# Patient Record
Sex: Female | Born: 1970 | Race: White | Hispanic: No | Marital: Married | Smoking: Never smoker
Health system: Southern US, Community
[De-identification: ages and names within clinical notes are randomized; demographics above are authoritative.]

## PROBLEM LIST (undated history)

## (undated) DIAGNOSIS — N979 Female infertility, unspecified: Secondary | ICD-10-CM

## (undated) DIAGNOSIS — E041 Nontoxic single thyroid nodule: Secondary | ICD-10-CM

## (undated) DIAGNOSIS — C801 Malignant (primary) neoplasm, unspecified: Secondary | ICD-10-CM

## (undated) DIAGNOSIS — G43909 Migraine, unspecified, not intractable, without status migrainosus: Secondary | ICD-10-CM

## (undated) HISTORY — DX: Female infertility, unspecified: N97.9

## (undated) HISTORY — DX: Malignant (primary) neoplasm, unspecified: C80.1

## (undated) HISTORY — DX: Migraine, unspecified, not intractable, without status migrainosus: G43.909

## (undated) HISTORY — DX: Nontoxic single thyroid nodule: E04.1

## (undated) HISTORY — PX: MASTECTOMY: SHX3

---

## 1999-10-20 ENCOUNTER — Encounter: Admission: RE | Admit: 1999-10-20 | Discharge: 1999-10-20 | Payer: Self-pay | Admitting: Gynecology

## 1999-10-20 ENCOUNTER — Encounter: Payer: Self-pay | Admitting: Gynecology

## 2000-07-31 ENCOUNTER — Other Ambulatory Visit: Admission: RE | Admit: 2000-07-31 | Discharge: 2000-07-31 | Payer: Self-pay | Admitting: Obstetrics and Gynecology

## 2000-11-13 ENCOUNTER — Encounter: Payer: Self-pay | Admitting: Obstetrics and Gynecology

## 2000-11-13 ENCOUNTER — Observation Stay (HOSPITAL_COMMUNITY): Admission: AD | Admit: 2000-11-13 | Discharge: 2000-11-13 | Payer: Self-pay | Admitting: Obstetrics and Gynecology

## 2001-02-11 ENCOUNTER — Inpatient Hospital Stay (HOSPITAL_COMMUNITY): Admission: AD | Admit: 2001-02-11 | Discharge: 2001-02-14 | Payer: Self-pay | Admitting: Obstetrics and Gynecology

## 2001-11-20 ENCOUNTER — Other Ambulatory Visit: Admission: RE | Admit: 2001-11-20 | Discharge: 2001-11-20 | Payer: Self-pay | Admitting: Gynecology

## 2003-01-04 ENCOUNTER — Inpatient Hospital Stay (HOSPITAL_COMMUNITY): Admission: AD | Admit: 2003-01-04 | Discharge: 2003-01-06 | Payer: Self-pay | Admitting: Obstetrics and Gynecology

## 2003-02-09 ENCOUNTER — Other Ambulatory Visit: Admission: RE | Admit: 2003-02-09 | Discharge: 2003-02-09 | Payer: Self-pay | Admitting: Obstetrics and Gynecology

## 2003-02-27 ENCOUNTER — Ambulatory Visit (HOSPITAL_BASED_OUTPATIENT_CLINIC_OR_DEPARTMENT_OTHER): Admission: RE | Admit: 2003-02-27 | Discharge: 2003-02-27 | Payer: Self-pay | Admitting: Otolaryngology

## 2003-11-03 ENCOUNTER — Other Ambulatory Visit: Admission: RE | Admit: 2003-11-03 | Discharge: 2003-11-03 | Payer: Self-pay | Admitting: Gynecology

## 2003-12-12 HISTORY — PX: BREAST LUMPECTOMY: SHX2

## 2004-10-10 ENCOUNTER — Encounter (INDEPENDENT_AMBULATORY_CARE_PROVIDER_SITE_OTHER): Payer: Self-pay | Admitting: *Deleted

## 2004-10-10 ENCOUNTER — Encounter: Admission: RE | Admit: 2004-10-10 | Discharge: 2004-10-10 | Payer: Self-pay | Admitting: Obstetrics and Gynecology

## 2004-10-11 ENCOUNTER — Encounter: Admission: RE | Admit: 2004-10-11 | Discharge: 2004-10-11 | Payer: Self-pay | Admitting: Obstetrics and Gynecology

## 2004-10-12 ENCOUNTER — Ambulatory Visit: Payer: Self-pay | Admitting: Oncology

## 2004-10-17 ENCOUNTER — Ambulatory Visit (HOSPITAL_BASED_OUTPATIENT_CLINIC_OR_DEPARTMENT_OTHER): Admission: RE | Admit: 2004-10-17 | Discharge: 2004-10-17 | Payer: Self-pay | Admitting: General Surgery

## 2004-10-17 ENCOUNTER — Encounter (HOSPITAL_BASED_OUTPATIENT_CLINIC_OR_DEPARTMENT_OTHER): Payer: Self-pay | Admitting: General Surgery

## 2004-10-17 ENCOUNTER — Encounter (INDEPENDENT_AMBULATORY_CARE_PROVIDER_SITE_OTHER): Payer: Self-pay | Admitting: *Deleted

## 2004-10-17 ENCOUNTER — Ambulatory Visit (HOSPITAL_COMMUNITY): Admission: RE | Admit: 2004-10-17 | Discharge: 2004-10-17 | Payer: Self-pay | Admitting: General Surgery

## 2004-11-02 ENCOUNTER — Ambulatory Visit (HOSPITAL_COMMUNITY): Admission: RE | Admit: 2004-11-02 | Discharge: 2004-11-02 | Payer: Self-pay | Admitting: Obstetrics and Gynecology

## 2004-12-09 ENCOUNTER — Ambulatory Visit: Payer: Self-pay | Admitting: Oncology

## 2004-12-11 HISTORY — PX: TRAM: SHX5363

## 2004-12-11 HISTORY — PX: ABDOMINAL HYSTERECTOMY: SHX81

## 2004-12-28 ENCOUNTER — Ambulatory Visit (HOSPITAL_COMMUNITY): Admission: RE | Admit: 2004-12-28 | Discharge: 2004-12-28 | Payer: Self-pay | Admitting: Obstetrics and Gynecology

## 2005-01-18 ENCOUNTER — Ambulatory Visit (HOSPITAL_COMMUNITY): Admission: RE | Admit: 2005-01-18 | Discharge: 2005-01-18 | Payer: Self-pay | Admitting: Obstetrics and Gynecology

## 2005-01-18 ENCOUNTER — Other Ambulatory Visit: Admission: RE | Admit: 2005-01-18 | Discharge: 2005-01-18 | Payer: Self-pay | Admitting: Obstetrics and Gynecology

## 2005-02-08 ENCOUNTER — Ambulatory Visit: Payer: Self-pay | Admitting: Oncology

## 2005-02-08 ENCOUNTER — Ambulatory Visit (HOSPITAL_COMMUNITY): Admission: RE | Admit: 2005-02-08 | Discharge: 2005-02-08 | Payer: Self-pay | Admitting: Obstetrics and Gynecology

## 2005-03-01 ENCOUNTER — Ambulatory Visit (HOSPITAL_COMMUNITY): Admission: RE | Admit: 2005-03-01 | Discharge: 2005-03-01 | Payer: Self-pay | Admitting: Obstetrics and Gynecology

## 2005-03-17 ENCOUNTER — Inpatient Hospital Stay (HOSPITAL_COMMUNITY): Admission: AD | Admit: 2005-03-17 | Discharge: 2005-03-19 | Payer: Self-pay | Admitting: Obstetrics and Gynecology

## 2005-03-17 ENCOUNTER — Encounter (INDEPENDENT_AMBULATORY_CARE_PROVIDER_SITE_OTHER): Payer: Self-pay | Admitting: *Deleted

## 2005-03-28 ENCOUNTER — Ambulatory Visit: Payer: Self-pay | Admitting: Oncology

## 2005-05-01 ENCOUNTER — Observation Stay (HOSPITAL_COMMUNITY): Admission: RE | Admit: 2005-05-01 | Discharge: 2005-05-02 | Payer: Self-pay | Admitting: Gynecology

## 2005-05-01 ENCOUNTER — Encounter (INDEPENDENT_AMBULATORY_CARE_PROVIDER_SITE_OTHER): Payer: Self-pay | Admitting: *Deleted

## 2005-05-18 ENCOUNTER — Ambulatory Visit: Payer: Self-pay | Admitting: Oncology

## 2005-07-18 ENCOUNTER — Ambulatory Visit (HOSPITAL_COMMUNITY): Admission: RE | Admit: 2005-07-18 | Discharge: 2005-07-18 | Payer: Self-pay | Admitting: Oncology

## 2005-07-21 ENCOUNTER — Ambulatory Visit: Payer: Self-pay | Admitting: Oncology

## 2005-07-28 ENCOUNTER — Inpatient Hospital Stay (HOSPITAL_COMMUNITY): Admission: RE | Admit: 2005-07-28 | Discharge: 2005-07-31 | Payer: Self-pay | Admitting: General Surgery

## 2005-07-28 ENCOUNTER — Encounter (INDEPENDENT_AMBULATORY_CARE_PROVIDER_SITE_OTHER): Payer: Self-pay | Admitting: Specialist

## 2005-07-31 ENCOUNTER — Ambulatory Visit: Payer: Self-pay | Admitting: Oncology

## 2005-09-22 ENCOUNTER — Ambulatory Visit: Payer: Self-pay | Admitting: Oncology

## 2005-10-19 ENCOUNTER — Ambulatory Visit (HOSPITAL_COMMUNITY): Admission: RE | Admit: 2005-10-19 | Discharge: 2005-10-19 | Payer: Self-pay | Admitting: Oncology

## 2005-10-25 ENCOUNTER — Ambulatory Visit (HOSPITAL_COMMUNITY): Admission: RE | Admit: 2005-10-25 | Discharge: 2005-10-25 | Payer: Self-pay | Admitting: Oncology

## 2005-11-06 ENCOUNTER — Other Ambulatory Visit: Admission: RE | Admit: 2005-11-06 | Discharge: 2005-11-06 | Payer: Self-pay | Admitting: Interventional Radiology

## 2005-11-06 ENCOUNTER — Encounter (INDEPENDENT_AMBULATORY_CARE_PROVIDER_SITE_OTHER): Payer: Self-pay | Admitting: *Deleted

## 2005-11-06 ENCOUNTER — Encounter: Admission: RE | Admit: 2005-11-06 | Discharge: 2005-11-06 | Payer: Self-pay | Admitting: Oncology

## 2005-12-28 ENCOUNTER — Ambulatory Visit: Payer: Self-pay | Admitting: Oncology

## 2006-02-19 ENCOUNTER — Ambulatory Visit: Payer: Self-pay | Admitting: Oncology

## 2006-08-09 ENCOUNTER — Ambulatory Visit: Payer: Self-pay | Admitting: Oncology

## 2006-08-14 LAB — COMPREHENSIVE METABOLIC PANEL
ALT: 18 U/L (ref 0–40)
AST: 18 U/L (ref 0–37)
Albumin: 4.7 g/dL (ref 3.5–5.2)
Alkaline Phosphatase: 95 U/L (ref 39–117)
BUN: 16 mg/dL (ref 6–23)
CO2: 30 mEq/L (ref 19–32)
Calcium: 9.8 mg/dL (ref 8.4–10.5)
Chloride: 102 mEq/L (ref 96–112)
Creatinine, Ser: 0.78 mg/dL (ref 0.40–1.20)
Glucose, Bld: 63 mg/dL — ABNORMAL LOW (ref 70–99)
Potassium: 4.4 mEq/L (ref 3.5–5.3)
Sodium: 140 mEq/L (ref 135–145)
Total Bilirubin: 0.6 mg/dL (ref 0.3–1.2)
Total Protein: 7.4 g/dL (ref 6.0–8.3)

## 2006-08-14 LAB — CBC WITH DIFFERENTIAL/PLATELET
BASO%: 0.5 % (ref 0.0–2.0)
Basophils Absolute: 0 10*3/uL (ref 0.0–0.1)
EOS%: 2.9 % (ref 0.0–7.0)
Eosinophils Absolute: 0.1 10*3/uL (ref 0.0–0.5)
HCT: 41.2 % (ref 34.8–46.6)
HGB: 14.5 g/dL (ref 11.6–15.9)
LYMPH%: 48.8 % — ABNORMAL HIGH (ref 14.0–48.0)
MCH: 30.8 pg (ref 26.0–34.0)
MCHC: 35.2 g/dL (ref 32.0–36.0)
MCV: 87.4 fL (ref 81.0–101.0)
MONO#: 0.3 10*3/uL (ref 0.1–0.9)
MONO%: 6.9 % (ref 0.0–13.0)
NEUT#: 1.9 10*3/uL (ref 1.5–6.5)
NEUT%: 40.9 % (ref 39.6–76.8)
Platelets: 189 10*3/uL (ref 145–400)
RBC: 4.72 10*6/uL (ref 3.70–5.32)
RDW: 12.8 % (ref 11.3–14.5)
WBC: 4.7 10*3/uL (ref 3.9–10.0)
lymph#: 2.3 10*3/uL (ref 0.9–3.3)

## 2006-08-14 LAB — TSH: TSH: 1.738 u[IU]/mL (ref 0.350–5.500)

## 2006-08-14 LAB — CANCER ANTIGEN 27.29: CA 27.29: 20 U/mL (ref 0–39)

## 2006-08-17 ENCOUNTER — Ambulatory Visit (HOSPITAL_COMMUNITY): Admission: RE | Admit: 2006-08-17 | Discharge: 2006-08-17 | Payer: Self-pay | Admitting: Oncology

## 2006-10-03 ENCOUNTER — Other Ambulatory Visit: Admission: RE | Admit: 2006-10-03 | Discharge: 2006-10-03 | Payer: Self-pay | Admitting: Gynecology

## 2006-12-06 ENCOUNTER — Ambulatory Visit: Payer: Self-pay | Admitting: Oncology

## 2006-12-14 LAB — COMPREHENSIVE METABOLIC PANEL
ALT: 21 U/L (ref 0–35)
AST: 15 U/L (ref 0–37)
Albumin: 5.1 g/dL (ref 3.5–5.2)
Alkaline Phosphatase: 127 U/L — ABNORMAL HIGH (ref 39–117)
BUN: 16 mg/dL (ref 6–23)
CO2: 30 mEq/L (ref 19–32)
Calcium: 10.1 mg/dL (ref 8.4–10.5)
Chloride: 102 mEq/L (ref 96–112)
Creatinine, Ser: 0.66 mg/dL (ref 0.40–1.20)
Glucose, Bld: 92 mg/dL (ref 70–99)
Potassium: 4.2 mEq/L (ref 3.5–5.3)
Sodium: 141 mEq/L (ref 135–145)
Total Bilirubin: 0.3 mg/dL (ref 0.3–1.2)
Total Protein: 7.8 g/dL (ref 6.0–8.3)

## 2006-12-14 LAB — CBC WITH DIFFERENTIAL/PLATELET
BASO%: 0.4 % (ref 0.0–2.0)
Basophils Absolute: 0 10*3/uL (ref 0.0–0.1)
EOS%: 3 % (ref 0.0–7.0)
Eosinophils Absolute: 0.1 10*3/uL (ref 0.0–0.5)
HCT: 42.4 % (ref 34.8–46.6)
HGB: 14.6 g/dL (ref 11.6–15.9)
LYMPH%: 49 % — ABNORMAL HIGH (ref 14.0–48.0)
MCH: 29.5 pg (ref 26.0–34.0)
MCHC: 34.3 g/dL (ref 32.0–36.0)
MCV: 85.8 fL (ref 81.0–101.0)
MONO#: 0.4 10*3/uL (ref 0.1–0.9)
MONO%: 8.5 % (ref 0.0–13.0)
NEUT#: 1.8 10*3/uL (ref 1.5–6.5)
NEUT%: 39.1 % — ABNORMAL LOW (ref 39.6–76.8)
Platelets: 214 10*3/uL (ref 145–400)
RBC: 4.94 10*6/uL (ref 3.70–5.32)
RDW: 13.2 % (ref 11.3–14.5)
WBC: 4.7 10*3/uL (ref 3.9–10.0)
lymph#: 2.3 10*3/uL (ref 0.9–3.3)

## 2006-12-14 LAB — CANCER ANTIGEN 27.29: CA 27.29: 17 U/mL (ref 0–39)

## 2006-12-14 LAB — FOLLICLE STIMULATING HORMONE: FSH: 122.4 m[IU]/mL

## 2006-12-17 ENCOUNTER — Ambulatory Visit (HOSPITAL_COMMUNITY): Admission: RE | Admit: 2006-12-17 | Discharge: 2006-12-17 | Payer: Self-pay | Admitting: Oncology

## 2006-12-24 LAB — ESTRADIOL, ULTRA SENS: Estradiol, Ultra Sensitive: 125 pg/mL

## 2007-03-13 ENCOUNTER — Ambulatory Visit: Payer: Self-pay | Admitting: Oncology

## 2007-03-17 IMAGING — CT NM PET TUM IMG SKULL BASE T - THIGH
1 series · 2 of 25 positions shown · IV contrast ([ID])
Comparison: Prior PET CT of 07/18/05.

CLINICAL DATA: Breast cancer with prior lumpectomy.  Hysterectomy this year.  Chemotherapy finished in June 2005.
FDG PET-CT TUMOR IMAGING (SKULL BASE TO THIGHS) ? 10/19/05: 
Fasting Blood Glucose:  84.
TECHNIQUE: 17.1 mCi F18-FDG were administered via the right antecubital vein.  Full ring PET imaging was performed from the skull base through the mid-thighs 43 minutes after injection.  CT data was obtained and used for attenuation correction and anatomic localization only.  (This was not acquired as a diagnostic CT examination.)

[Series 2: ct images · axial · 3.8mm · 0.98mm/px · z∈[-111,-75]mm · 2 of 267 slices shown]
[im 233/267  brain]
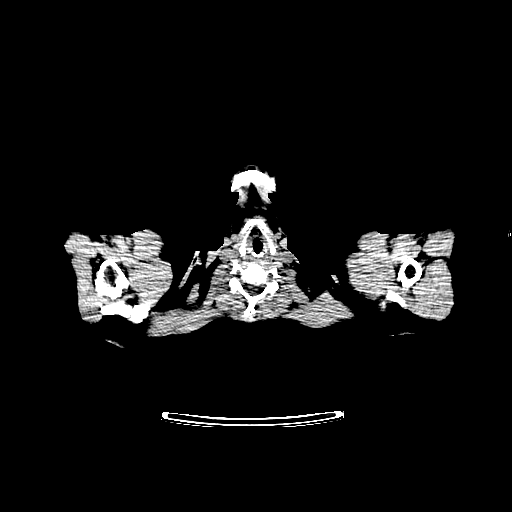
[im 244/267  brain]
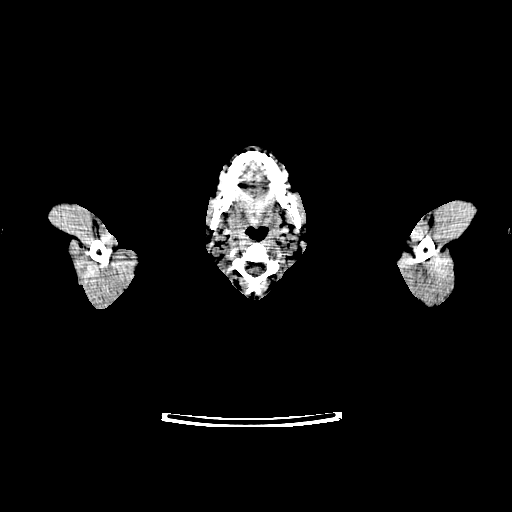

[2 of 25 positions shown; findings below may reference images not displayed]

FINDINGS: Review of the CT data confirms findings on recent diagnostic CT scan.  The patient has had interval TRAM flap breast reconstruction.  I checked hepatic cysts are noted and appear stable and benign.  
There is some mildly increased activity at the lower end of the TRAM flap wound, which is believe to be postoperative in nature.  Previously, there was noted to be some adnexal activity for which follow-up was recommended.  Currently we do not demonstrate significant abnormal adnexal activity separate from the blood pool of the iliac vessels.  No adenopathy is identified nor is there significant ovarian remnant noted in the patient who is status post hysterectomy and oophorectomy.  There is no significant abnormal metabolic activity to suggest residual or recurrent breast malignancy.  The patient does have a known small right thyroid nodule.  This has borderline increased activity inferiorly, and if not previously performed then thyroid ultrasound would be suggested for further workup.  Specifically, although thyroid adenomas can be increased on PET evaluation, the possibility of thyroid malignancy cannot be completely discounted and further workup is recommended.
IMPRESSION: 1.  No evidence of residual or recurrent breast cancer.
2.  Faint activity in a right thyroid nodule indicates need for further workup.  Initially I would recommend thyroid ultrasound.

## 2007-03-18 LAB — CBC WITH DIFFERENTIAL/PLATELET
BASO%: 0.5 % (ref 0.0–2.0)
Basophils Absolute: 0 10*3/uL (ref 0.0–0.1)
EOS%: 1.2 % (ref 0.0–7.0)
Eosinophils Absolute: 0 10*3/uL (ref 0.0–0.5)
HCT: 38.9 % (ref 34.8–46.6)
HGB: 14 g/dL (ref 11.6–15.9)
LYMPH%: 43.7 % (ref 14.0–48.0)
MCH: 30.4 pg (ref 26.0–34.0)
MCHC: 36 g/dL (ref 32.0–36.0)
MCV: 84.4 fL (ref 81.0–101.0)
MONO#: 0.3 10*3/uL (ref 0.1–0.9)
MONO%: 7.8 % (ref 0.0–13.0)
NEUT#: 2 10*3/uL (ref 1.5–6.5)
NEUT%: 46.8 % (ref 39.6–76.8)
Platelets: 156 10*3/uL (ref 145–400)
RBC: 4.61 10*6/uL (ref 3.70–5.32)
RDW: 12.9 % (ref 11.3–14.5)
WBC: 4.3 10*3/uL (ref 3.9–10.0)
lymph#: 1.9 10*3/uL (ref 0.9–3.3)

## 2007-03-18 LAB — CANCER ANTIGEN 27.29: CA 27.29: 23 U/mL (ref 0–39)

## 2007-03-18 LAB — COMPREHENSIVE METABOLIC PANEL
ALT: 14 U/L (ref 0–35)
AST: 15 U/L (ref 0–37)
Albumin: 4.7 g/dL (ref 3.5–5.2)
Alkaline Phosphatase: 116 U/L (ref 39–117)
BUN: 14 mg/dL (ref 6–23)
CO2: 26 mEq/L (ref 19–32)
Calcium: 9.4 mg/dL (ref 8.4–10.5)
Chloride: 104 mEq/L (ref 96–112)
Creatinine, Ser: 0.68 mg/dL (ref 0.40–1.20)
Glucose, Bld: 86 mg/dL (ref 70–99)
Potassium: 3.8 mEq/L (ref 3.5–5.3)
Sodium: 140 mEq/L (ref 135–145)
Total Bilirubin: 0.5 mg/dL (ref 0.3–1.2)
Total Protein: 7.4 g/dL (ref 6.0–8.3)

## 2007-09-10 ENCOUNTER — Ambulatory Visit: Payer: Self-pay | Admitting: Oncology

## 2007-09-13 LAB — CBC WITH DIFFERENTIAL/PLATELET
BASO%: 0.5 % (ref 0.0–2.0)
Basophils Absolute: 0 10*3/uL (ref 0.0–0.1)
EOS%: 2.7 % (ref 0.0–7.0)
Eosinophils Absolute: 0.1 10*3/uL (ref 0.0–0.5)
HCT: 40.4 % (ref 34.8–46.6)
HGB: 14.5 g/dL (ref 11.6–15.9)
LYMPH%: 40.8 % (ref 14.0–48.0)
MCH: 30.1 pg (ref 26.0–34.0)
MCHC: 35.8 g/dL (ref 32.0–36.0)
MCV: 84.1 fL (ref 81.0–101.0)
MONO#: 0.3 10*3/uL (ref 0.1–0.9)
MONO%: 5.7 % (ref 0.0–13.0)
NEUT#: 2.3 10*3/uL (ref 1.5–6.5)
NEUT%: 50.3 % (ref 39.6–76.8)
Platelets: 180 10*3/uL (ref 145–400)
RBC: 4.81 10*6/uL (ref 3.70–5.32)
RDW: 13.2 % (ref 11.3–14.5)
WBC: 4.5 10*3/uL (ref 3.9–10.0)
lymph#: 1.8 10*3/uL (ref 0.9–3.3)

## 2007-09-13 LAB — COMPREHENSIVE METABOLIC PANEL
ALT: 21 U/L (ref 0–35)
AST: 18 U/L (ref 0–37)
Albumin: 4.6 g/dL (ref 3.5–5.2)
Alkaline Phosphatase: 111 U/L (ref 39–117)
BUN: 19 mg/dL (ref 6–23)
CO2: 26 mEq/L (ref 19–32)
Calcium: 9.2 mg/dL (ref 8.4–10.5)
Chloride: 103 mEq/L (ref 96–112)
Creatinine, Ser: 0.78 mg/dL (ref 0.40–1.20)
Glucose, Bld: 79 mg/dL (ref 70–99)
Potassium: 4 mEq/L (ref 3.5–5.3)
Sodium: 139 mEq/L (ref 135–145)
Total Bilirubin: 0.7 mg/dL (ref 0.3–1.2)
Total Protein: 7.2 g/dL (ref 6.0–8.3)

## 2007-09-13 LAB — CANCER ANTIGEN 27.29: CA 27.29: 19 U/mL (ref 0–39)

## 2007-09-13 LAB — FOLLICLE STIMULATING HORMONE: FSH: 126.9 m[IU]/mL

## 2007-09-17 ENCOUNTER — Ambulatory Visit (HOSPITAL_COMMUNITY): Admission: RE | Admit: 2007-09-17 | Discharge: 2007-09-17 | Payer: Self-pay | Admitting: Oncology

## 2007-09-23 LAB — ESTRADIOL, ULTRA SENS: Estradiol, Ultra Sensitive: 11 pg/mL

## 2007-09-30 ENCOUNTER — Ambulatory Visit (HOSPITAL_COMMUNITY): Admission: RE | Admit: 2007-09-30 | Discharge: 2007-09-30 | Payer: Self-pay | Admitting: Oncology

## 2008-03-18 ENCOUNTER — Ambulatory Visit: Payer: Self-pay | Admitting: Oncology

## 2008-03-23 LAB — CBC WITH DIFFERENTIAL/PLATELET
BASO%: 0.7 % (ref 0.0–2.0)
Basophils Absolute: 0 10*3/uL (ref 0.0–0.1)
EOS%: 2.6 % (ref 0.0–7.0)
Eosinophils Absolute: 0.1 10*3/uL (ref 0.0–0.5)
HCT: 37.6 % (ref 34.8–46.6)
HGB: 13 g/dL (ref 11.6–15.9)
LYMPH%: 48.6 % — ABNORMAL HIGH (ref 14.0–48.0)
MCH: 29.2 pg (ref 26.0–34.0)
MCHC: 34.5 g/dL (ref 32.0–36.0)
MCV: 84.7 fL (ref 81.0–101.0)
MONO#: 0.2 10*3/uL (ref 0.1–0.9)
MONO%: 7.9 % (ref 0.0–13.0)
NEUT#: 1.2 10*3/uL — ABNORMAL LOW (ref 1.5–6.5)
NEUT%: 40.2 % (ref 39.6–76.8)
Platelets: 169 10*3/uL (ref 145–400)
RBC: 4.44 10*6/uL (ref 3.70–5.32)
RDW: 13.8 % (ref 11.3–14.5)
WBC: 3.1 10*3/uL — ABNORMAL LOW (ref 3.9–10.0)
lymph#: 1.5 10*3/uL (ref 0.9–3.3)

## 2008-03-23 LAB — COMPREHENSIVE METABOLIC PANEL
ALT: 15 U/L (ref 0–35)
AST: 14 U/L (ref 0–37)
Albumin: 4.4 g/dL (ref 3.5–5.2)
Alkaline Phosphatase: 88 U/L (ref 39–117)
BUN: 14 mg/dL (ref 6–23)
CO2: 26 mEq/L (ref 19–32)
Calcium: 9.6 mg/dL (ref 8.4–10.5)
Chloride: 107 mEq/L (ref 96–112)
Creatinine, Ser: 0.79 mg/dL (ref 0.40–1.20)
Glucose, Bld: 80 mg/dL (ref 70–99)
Potassium: 3.9 mEq/L (ref 3.5–5.3)
Sodium: 144 mEq/L (ref 135–145)
Total Bilirubin: 0.4 mg/dL (ref 0.3–1.2)
Total Protein: 6.8 g/dL (ref 6.0–8.3)

## 2008-03-23 LAB — CANCER ANTIGEN 27.29: CA 27.29: 19 U/mL (ref 0–39)

## 2008-03-23 LAB — FOLLICLE STIMULATING HORMONE: FSH: 111.8 m[IU]/mL

## 2008-03-24 ENCOUNTER — Ambulatory Visit (HOSPITAL_COMMUNITY): Admission: RE | Admit: 2008-03-24 | Discharge: 2008-03-24 | Payer: Self-pay | Admitting: Oncology

## 2008-04-01 LAB — ESTRADIOL, ULTRA SENS: Estradiol, Ultra Sensitive: 12 pg/mL

## 2008-04-06 ENCOUNTER — Other Ambulatory Visit: Admission: RE | Admit: 2008-04-06 | Discharge: 2008-04-06 | Payer: Self-pay | Admitting: Gynecology

## 2009-03-22 ENCOUNTER — Ambulatory Visit: Payer: Self-pay | Admitting: Oncology

## 2009-03-24 LAB — CBC WITH DIFFERENTIAL/PLATELET
BASO%: 0.5 % (ref 0.0–2.0)
Basophils Absolute: 0 10*3/uL (ref 0.0–0.1)
EOS%: 2.9 % (ref 0.0–7.0)
Eosinophils Absolute: 0.1 10*3/uL (ref 0.0–0.5)
HCT: 43.2 % (ref 34.8–46.6)
HGB: 14.8 g/dL (ref 11.6–15.9)
LYMPH%: 41.5 % (ref 14.0–49.7)
MCH: 29.6 pg (ref 25.1–34.0)
MCHC: 34.2 g/dL (ref 31.5–36.0)
MCV: 86.4 fL (ref 79.5–101.0)
MONO#: 0.3 10*3/uL (ref 0.1–0.9)
MONO%: 7.2 % (ref 0.0–14.0)
NEUT#: 2 10*3/uL (ref 1.5–6.5)
NEUT%: 47.9 % (ref 38.4–76.8)
Platelets: 170 10*3/uL (ref 145–400)
RBC: 5 10*6/uL (ref 3.70–5.45)
RDW: 13.2 % (ref 11.2–14.5)
WBC: 4.2 10*3/uL (ref 3.9–10.3)
lymph#: 1.7 10*3/uL (ref 0.9–3.3)

## 2009-03-24 LAB — TSH: TSH: 1.297 u[IU]/mL (ref 0.350–4.500)

## 2009-03-24 LAB — COMPREHENSIVE METABOLIC PANEL
ALT: 17 U/L (ref 0–35)
AST: 18 U/L (ref 0–37)
Albumin: 4.8 g/dL (ref 3.5–5.2)
Alkaline Phosphatase: 120 U/L — ABNORMAL HIGH (ref 39–117)
BUN: 22 mg/dL (ref 6–23)
CO2: 28 mEq/L (ref 19–32)
Calcium: 9.4 mg/dL (ref 8.4–10.5)
Chloride: 103 mEq/L (ref 96–112)
Creatinine, Ser: 0.76 mg/dL (ref 0.40–1.20)
Glucose, Bld: 61 mg/dL — ABNORMAL LOW (ref 70–99)
Potassium: 3.7 mEq/L (ref 3.5–5.3)
Sodium: 141 mEq/L (ref 135–145)
Total Bilirubin: 0.6 mg/dL (ref 0.3–1.2)
Total Protein: 7.5 g/dL (ref 6.0–8.3)

## 2009-03-24 LAB — CANCER ANTIGEN 27.29: CA 27.29: 24 U/mL (ref 0–39)

## 2009-04-06 LAB — HEPATIC FUNCTION PANEL
ALT: 17 U/L (ref 0–35)
AST: 16 U/L (ref 0–37)
Albumin: 4.8 g/dL (ref 3.5–5.2)
Alkaline Phosphatase: 111 U/L (ref 39–117)
Bilirubin, Direct: 0.1 mg/dL (ref 0.0–0.3)
Indirect Bilirubin: 0.4 mg/dL (ref 0.0–0.9)
Total Bilirubin: 0.5 mg/dL (ref 0.3–1.2)
Total Protein: 7.5 g/dL (ref 6.0–8.3)

## 2010-01-03 ENCOUNTER — Ambulatory Visit (HOSPITAL_COMMUNITY): Admission: RE | Admit: 2010-01-03 | Discharge: 2010-01-03 | Payer: Self-pay | Admitting: Oncology

## 2010-03-04 ENCOUNTER — Ambulatory Visit: Payer: Self-pay | Admitting: Oncology

## 2010-03-08 LAB — COMPREHENSIVE METABOLIC PANEL
ALT: 17 U/L (ref 0–35)
AST: 20 U/L (ref 0–37)
Albumin: 4.8 g/dL (ref 3.5–5.2)
Alkaline Phosphatase: 84 U/L (ref 39–117)
BUN: 12 mg/dL (ref 6–23)
CO2: 25 mEq/L (ref 19–32)
Calcium: 9.4 mg/dL (ref 8.4–10.5)
Chloride: 104 mEq/L (ref 96–112)
Creatinine, Ser: 0.7 mg/dL (ref 0.40–1.20)
Glucose, Bld: 87 mg/dL (ref 70–99)
Potassium: 3.8 mEq/L (ref 3.5–5.3)
Sodium: 141 mEq/L (ref 135–145)
Total Bilirubin: 0.6 mg/dL (ref 0.3–1.2)
Total Protein: 7.1 g/dL (ref 6.0–8.3)

## 2010-03-08 LAB — CANCER ANTIGEN 27.29: CA 27.29: 15 U/mL (ref 0–39)

## 2010-03-08 LAB — CBC WITH DIFFERENTIAL/PLATELET
BASO%: 0.6 % (ref 0.0–2.0)
Basophils Absolute: 0 10*3/uL (ref 0.0–0.1)
EOS%: 2.2 % (ref 0.0–7.0)
Eosinophils Absolute: 0.1 10*3/uL (ref 0.0–0.5)
HCT: 37.9 % (ref 34.8–46.6)
HGB: 13.1 g/dL (ref 11.6–15.9)
LYMPH%: 42.4 % (ref 14.0–49.7)
MCH: 30.7 pg (ref 25.1–34.0)
MCHC: 34.7 g/dL (ref 31.5–36.0)
MCV: 88.5 fL (ref 79.5–101.0)
MONO#: 0.2 10*3/uL (ref 0.1–0.9)
MONO%: 7.7 % (ref 0.0–14.0)
NEUT#: 1.5 10*3/uL (ref 1.5–6.5)
NEUT%: 47.1 % (ref 38.4–76.8)
Platelets: 161 10*3/uL (ref 145–400)
RBC: 4.28 10*6/uL (ref 3.70–5.45)
RDW: 14 % (ref 11.2–14.5)
WBC: 3.2 10*3/uL — ABNORMAL LOW (ref 3.9–10.3)
lymph#: 1.4 10*3/uL (ref 0.9–3.3)

## 2010-04-18 ENCOUNTER — Ambulatory Visit: Payer: Self-pay | Admitting: Oncology

## 2010-08-05 ENCOUNTER — Ambulatory Visit: Payer: Self-pay | Admitting: Oncology

## 2010-12-16 ENCOUNTER — Ambulatory Visit (HOSPITAL_COMMUNITY)
Admission: RE | Admit: 2010-12-16 | Discharge: 2010-12-16 | Payer: Self-pay | Source: Home / Self Care | Attending: Oncology | Admitting: Oncology

## 2011-01-01 ENCOUNTER — Encounter: Payer: Self-pay | Admitting: Oncology

## 2011-01-02 ENCOUNTER — Encounter: Payer: Self-pay | Admitting: Oncology

## 2011-01-10 ENCOUNTER — Other Ambulatory Visit (HOSPITAL_COMMUNITY)
Admission: RE | Admit: 2011-01-10 | Discharge: 2011-01-10 | Disposition: A | Discharge status: Home / Self Care | Payer: BC Managed Care – PPO | Source: Ambulatory Visit | Attending: Diagnostic Radiology | Admitting: Diagnostic Radiology

## 2011-01-10 ENCOUNTER — Other Ambulatory Visit (HOSPITAL_COMMUNITY): Admission: RE | Admit: 2011-01-10 | Payer: Self-pay | Source: Ambulatory Visit | Admitting: Diagnostic Radiology

## 2011-01-10 ENCOUNTER — Other Ambulatory Visit: Payer: Self-pay | Admitting: Diagnostic Radiology

## 2011-01-10 ENCOUNTER — Encounter
Admission: RE | Admit: 2011-01-10 | Discharge: 2011-01-10 | Payer: Self-pay | Source: Home / Self Care | Attending: Surgery | Admitting: Surgery

## 2011-01-10 DIAGNOSIS — E049 Nontoxic goiter, unspecified: Secondary | ICD-10-CM | POA: Insufficient documentation

## 2011-02-10 ENCOUNTER — Other Ambulatory Visit: Payer: Self-pay | Admitting: Oncology

## 2011-02-10 DIAGNOSIS — Z9889 Other specified postprocedural states: Secondary | ICD-10-CM

## 2011-02-13 ENCOUNTER — Other Ambulatory Visit: Payer: Self-pay | Admitting: Oncology

## 2011-02-13 DIAGNOSIS — C50919 Malignant neoplasm of unspecified site of unspecified female breast: Secondary | ICD-10-CM

## 2011-02-21 ENCOUNTER — Ambulatory Visit
Admission: RE | Admit: 2011-02-21 | Discharge: 2011-02-21 | Disposition: A | Discharge status: Home / Self Care | Payer: BC Managed Care – PPO | Source: Ambulatory Visit | Attending: Oncology | Admitting: Oncology

## 2011-02-21 ENCOUNTER — Other Ambulatory Visit: Payer: Self-pay | Admitting: Oncology

## 2011-02-21 DIAGNOSIS — Z9889 Other specified postprocedural states: Secondary | ICD-10-CM

## 2011-02-21 DIAGNOSIS — Z1239 Encounter for other screening for malignant neoplasm of breast: Secondary | ICD-10-CM

## 2011-02-23 ENCOUNTER — Inpatient Hospital Stay (HOSPITAL_COMMUNITY): Admission: RE | Admit: 2011-02-23 | Payer: BC Managed Care – PPO | Source: Ambulatory Visit

## 2011-02-24 ENCOUNTER — Ambulatory Visit (HOSPITAL_COMMUNITY)
Admission: RE | Admit: 2011-02-24 | Discharge: 2011-02-24 | Disposition: A | Discharge status: Home / Self Care | Payer: BC Managed Care – PPO | Source: Ambulatory Visit | Attending: Oncology | Admitting: Oncology

## 2011-02-24 DIAGNOSIS — K7689 Other specified diseases of liver: Secondary | ICD-10-CM | POA: Insufficient documentation

## 2011-02-24 DIAGNOSIS — C50919 Malignant neoplasm of unspecified site of unspecified female breast: Secondary | ICD-10-CM | POA: Insufficient documentation

## 2011-02-24 DIAGNOSIS — Z9010 Acquired absence of unspecified breast and nipple: Secondary | ICD-10-CM | POA: Insufficient documentation

## 2011-02-24 MED ORDER — GADOBENATE DIMEGLUMINE 529 MG/ML IV SOLN
15.0000 mL | Freq: Once | INTRAVENOUS | Status: AC | PRN
  Administered 2011-02-24: 15 mL via INTRAVENOUS

## 2011-04-28 NOTE — Op Note (Signed)
NAMEBRADI, ARBUTHNOT NO.:  [id number]   MEDICAL RECORD NO.:  [id number]          PATIENT TYPE:  INP   LOCATION:  2550                         FACILITY:  [facility]   PHYSICIAN:  Etter Sjogren, M.D.     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   PROCEDURE PERFORMED:  1.  Bilateral transverse rectus abdominis myocutaneous flap reconstruction.  2.  Placement of an On-Q pump.   SURGEON:  Etter Sjogren, M.D.   ASSISTANT:  Pleas Patricia, M.D.   ANESTHESIA:  General.   ESTIMATED BLOOD LOSS:  150 cc.   DRAINS:  One Harrison Mons to each chest and two for abdomen.   CLINICAL NOTE:  A 40 year old woman has left breast cancer.  Has the BRCA  gene and is scheduled for bilateral breast mastectomy.  She desires  reconstruction and prefers to use her own tissue.  This was discussed with  her in great detail, and she understood the risks plus implications.  An  extensive amount of time was spent in counseling on two separate occasions  and she wished to proceed.   PROCEDURE:  The patient was taken to the operating room.  The mastectomy has  been completed.  The incision was made around the umbilicus and then  generous fatty stalk left with the umbilicus.  The upper incision was made.  The dissection was carried out in a cephalad direction, minimizing and  undermining as much as possible due to her overall weight.  Subcutaneous  tunnels were made, connecting through to the mastectomy sites.  The lower  limb incision was then made after first checking to make sure that the  closure could be achieved.  The lower limb incision was made, and dissection  carried down to the fascia using  electrocautery.  Great care was taken to  avoid any damage to the underlying rectus muscles on these dissections.  The  flaps had been elevated up to the lateral row of perforators.  Flap was  divided in the midline and elevated over just to the left and the right of  the linea alba on each side.  The parallel incisions were then made in the  anterior fascia for the left and right rectus muscles, leaving a little over  2 cm wide strip on the anterior surface of the rectus muscles.  The rectus  muscle was then mobilized gently from the surrounding fascial attachments  using scalpel and bipolar and taking great care at the tendinous  inscriptions.  Having mobilized the flaps entirely, the deep inferior  epigastric vessels were identified bilaterally, and the rectus muscles  divided, leaving the deep, inferior epigastric vessels in place for now.   Attention was directed to the chest.  Thorough irrigation with saline.  Meticulous hemostasis was achieved with electrocautery.  Blake drains were  brought through the separate stab wounds inferolaterally and secured with a  3-0 Prolene suture.  The attention was then directed back to the abdomen,  where the deep inferior gastric vessels were divided bilaterally, using  triple ligations for the arteries and double ligation for the veins.  The  flaps were then gently passed through the subcutaneous tunnels onto the  right and left chest, respectively.  These were ipsilateral flaps.  Direct  care was taken to make sure that the muscle pedicles were not twisted or  torqued in any fashion.  The flaps had excellent color and bright red  bleeding around the periphery, consistent with viability.  The flaps had  excellent color and bright red bleeding around the periphery, consistent  with viability.  They were temporarily secured with skin staples covered  with a dry towel for warmth.  Attention was directed back to the abdomen.  The abdomen was irrigated  thoroughly with saline.  Hemostasis was achieved  using electrocautery.  The deep epigastric pedicles were checked, and  excellent hemostasis was noted.  The muscle pedicles for the flaps were also  checked.  There was no tension and no torquing of the muscle flaps.  In  closure, 0 Prolene interrupted figure-of-eight sutures, taking great care to  avoid damaging the intra-abdominal contents.  The fascia was closed  bilaterally.  The wound was irrigated with copious amounts of saline.  An  Onlay mesh was then placed, bringing the umbilicus up through the center of  the mesh and securing the mesh around the periphery using 2-0 Prolene simple  running suture.  Again, great care taken to avoid damage to the underlying  intra-abdominal contents.  Thorough irrigation again with saline.  Excellent  hemostasis was confirmed.  Blake drains were positioned, brought out through  separate stab wounds inferiorly, and secured with 3-0 Prolene sutures.  The  On-Q pump was then positioned in the abdomen.  Closure with 2-0 Vicryl  interrupted deep sutures for the deep Scarpa's fascial layer, 3-0 Monocryl  interrupted deep sutures, and 3-0 Monocryl running subcuticular sutures.  An  incision was then made at the midline just over the umbilicus, and the  umbilicus brought to this opening.  It was inspected and found to have  excellent color and was viable.  The umbilicus was in-set using 3-0 Vicryl  interrupted deep sutures.  Skin closure was very healthy and no evidence of  any skin compromise with the abdomen.   Attention was then directed back to the chest.  Thorough irrigation with  saline.  Meticulous hemostasis was achieved.  Excellent hemostasis having  been confirmed.  The inferior border of the flaps was in-set using 3-0  Monocryl interrupted deep sutures and 3-0 Monocryl running subcuticular  suture.  The flaps were then marked for the epithelization.  The tip of zone 2 was amputated bilaterally,  and it was bright red bleeding, consistent with  viability.  The flaps continued to have excellent color, and bright red  bleeding at the periphery, consistent with viability.  The deep  epithelization was performed, and the flaps were then suspended with 3-0  Vicryl sutures from the pectoralis fascia superiorly.  The remainder of the  flap in-setting with 3-0 Monocryl interrupted deep sutures, and 3-0 Monocryl  running subcuticular suture.  This procedure was six hours with two plastic  surgeons.  This was clearly greater than usual services.  This patient was  certainly heavy and with the bilateral  abdominal closure, this was a very  lengthy procedure.  The abdominal closure itself requiring two hours with  two surgeons working.  She was transferred to the recovery room in stable  condition, having tolerated the procedure well.      Etter Sjogren, M.D.  Electronically Signed     DB/MEDQ  D:  07/28/2005  T:  07/28/2005  Job:  [id number]

## 2011-04-28 NOTE — Discharge Summary (Signed)
NAMEMARONDA, CAISON NO.:  [id number]   MEDICAL RECORD NO.:  [id number]          PATIENT TYPE:  INP   LOCATION:  5732                         FACILITY:  [facility]   PHYSICIAN:  Etter Sjogren, M.D.     DATE OF BIRTH:  [birth year]   DATE OF ADMISSION:  07/28/2005  DATE OF DISCHARGE:  07/31/2005                                 DISCHARGE SUMMARY   FINAL DIAGNOSES:  1.  Breast cancer.  2.  Positive BRCA gene.   PROCEDURE PERFORMED:  1.  Bilateral mastectomy.  2.  Bilateral breast reconstruction with transverse rectus abdominis      myocutaneous flaps.   HISTORY OF PRESENT ILLNESS:  This 40 year old woman has breast cancer.  She  has a positive BRCA gene, and she had a hysterectomy 3 months ago and  presents for bilateral mastectomy due to the presence of that gene.  She  desired a breast reconstruction.  This was discussed with her extensively,  and she preferred to use her own tissue using the TRAM flaps.  The nature of  the procedure and its risks were discussed with her in detail and she wished  to proceed.  For further details of the History and Physical, please see the  chart.   COURSE IN HOSPITAL:  On admission, she was taken to surgery at which time a  bilateral mastectomy and bilateral reconstruction with transverse rectus  abdominis myocutaneous flaps were performed.  She tolerated this well.  The  flaps looked good initially, but in the recovery room the left flap did  become somewhat venous congested.  Medical leech therapy was performed.  This was continued into the evening that night.  There was excellent color  in the flap.  The flap converted to a pink color with 1 to 2 second  capillary refill.  No further leech therapy was needed.  She continued to do  well.  However, she was observed in the intensive care unit for 2 days to be  certain that further leech therapy would not be needed.  Urine output was  excellent.  Drains functioning.  No evidence of  any hematoma.  Postoperative  hemoglobins initially over 10, and then 2 days later 9.5 and stable.  By the  third day, she was tolerating a regular diet, was ambulating well, both  flaps had excellent color, capillary refill was 1 to 2 seconds, and the  abdominal skin flap also excellent color and no evidence of any vascular  compromise.  It was felt that she was ready to be discharge.  The On-Q pump  was removed this morning.   DISPOSITION:  1.  She is discharged on [facility], a total of 40, to be given 1 to 2      p.o. q.6h p.r.n. for pain, Keflex 500 mg p.o. q.i.d.  2.  Her activity restrictions will be no lifting or vigorous activities.  3.  Empty drains 3 times a day and record the amount.  4.  Follow up in the office in a week.     Etter Sjogren, M.D.  Electronically  Signed    DB/MEDQ  D:  07/31/2005  T:  07/31/2005  Job:  [id number]

## 2011-04-28 NOTE — H&P (Signed)
[name], Burton NO.:  [id number]   MEDICAL RECORD NO.:  [id number]          PATIENT TYPE:  AMB   LOCATION:  DAY                          FACILITY:  [facility]   PHYSICIAN:  Howard C. Mezer, M.D.  DATE OF BIRTH:  [birth year]   DATE OF ADMISSION:  DATE OF DISCHARGE:                                HISTORY & PHYSICAL   ADMITTING DIAGNOSIS:  Breast cancer and BRCA-1 positive.   HISTORY OF PRESENT ILLNESS:  The patient is a 40 year old white female,  approximately 6 weeks postpartum, status post lumpectomy and sentinel node  biopsy, admitted with breast cancer and BRCA-1 positive, for hysterectomy  and bilateral salpingo-oophorectomy.  The patient was diagnosed with  invasive ductal carcinoma at approximately [redacted] weeks gestation with her last  pregnancy, and has undergone a lumpectomy with sentinel node biopsy, and has  begun chemotherapy with Taxol.  At the recommendation of her oncologist, a  bilateral salpingo-oophorectomy will be performed.  After counseling by both  Dr. Dierdre Forth and myself, the patient has elected to proceed with  hysterectomy and bilateral salpingo-oophorectomy.  This procedure has been  discussed in detail with the patient and her husband, and potential  complications including, but not limited to, anesthesia, injury to the  bowel, bladder, ureters, possible fistula formation, possible blood loss  with transfusion and its sequelae, and possible infection in the wound or  the pelvis have been discussed in detail.  If the cervix is left, the  patient understands that there is a possible need to remove the cervix in  the future, and the possibility that she will have long-standing bleeding  from this cervical remnant.  The patient also understands that removing her  ovaries at this time does not guarantee that she will never have ovarian  cancer.  The patient understands that she may have significant menopausal  symptoms after the ovaries are  removed, and that it may be difficult to  treat these symptoms, as estrogen will be contraindicated.  Postoperative  expectations and restrictions have been reviewed in detail.  Pain control  has been reviewed.  The patient understands that after hysterectomy and  bilateral salpingo-oophorectomy there is no chance that she will be able to  become pregnant in the future.  All the patient's questions have been  answered, and she appears to have reasonable expectations regarding the  procedure and its alternatives.   PAST MEDICAL HISTORY:  1.  Surgical D&C.  2.  Diagnostic laparoscopy.  3.  Lumpectomy and sentinel node biopsy.   MEDICAL:  Breast cancer.   MEDICATIONS:  Chemotherapy with Taxol.  No herbs or supplements.   ALLERGIES:  None known.   SMOKES:  None.   ETOH:  None.   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   FAMILY HISTORY:  Negative for cancer.   PHYSICAL EXAMINATION:  HEENT:  Negative.  LUNGS:  Clear.  HEART:  Without murmurs.  BREASTS:  Without palpable new masses or discharge.  ABDOMEN:  Soft and nontender.  PELVIC:  EGBUS, vagina, and cervix normal.  The  uterus is anterior, normal  in size.  Adnexa are without palpable masses.  EXTREMITIES:  Negative.   IMPRESSION:  Breast cancer and BRCA-1 positive.   PLAN:  Hysterectomy with bilateral salpingo-oophorectomy.      HCM/MEDQ  D:  04/30/2005  T:  04/30/2005  Job:  [id number]   cc:   C. Duane Lope, M.D.  [address]  [place]  [place] [address]  Fax: [phone]   Valentino Hue. Magrinat, M.D.  [address] [facility]  [place]  [place] [address]  Fax: [phone]

## 2011-05-03 ENCOUNTER — Encounter (INDEPENDENT_AMBULATORY_CARE_PROVIDER_SITE_OTHER): Payer: Self-pay | Admitting: Surgery

## 2011-08-23 ENCOUNTER — Encounter (HOSPITAL_BASED_OUTPATIENT_CLINIC_OR_DEPARTMENT_OTHER): Payer: BC Managed Care – PPO | Admitting: Oncology

## 2011-08-23 ENCOUNTER — Other Ambulatory Visit: Payer: Self-pay | Admitting: Oncology

## 2011-08-23 DIAGNOSIS — C50919 Malignant neoplasm of unspecified site of unspecified female breast: Secondary | ICD-10-CM

## 2011-08-23 LAB — CBC WITH DIFFERENTIAL/PLATELET
BASO%: 0.6 % (ref 0.0–2.0)
Basophils Absolute: 0 10*3/uL (ref 0.0–0.1)
EOS%: 2.3 % (ref 0.0–7.0)
Eosinophils Absolute: 0.1 10*3/uL (ref 0.0–0.5)
HCT: 38.9 % (ref 34.8–46.6)
HGB: 13.6 g/dL (ref 11.6–15.9)
LYMPH%: 39.7 % (ref 14.0–49.7)
MCH: 30.7 pg (ref 25.1–34.0)
MCHC: 34.9 g/dL (ref 31.5–36.0)
MCV: 88.1 fL (ref 79.5–101.0)
MONO#: 0.3 10*3/uL (ref 0.1–0.9)
MONO%: 7.3 % (ref 0.0–14.0)
NEUT#: 2.1 10*3/uL (ref 1.5–6.5)
NEUT%: 50.1 % (ref 38.4–76.8)
Platelets: 163 10*3/uL (ref 145–400)
RBC: 4.42 10*6/uL (ref 3.70–5.45)
RDW: 12.9 % (ref 11.2–14.5)
WBC: 4.1 10*3/uL (ref 3.9–10.3)
lymph#: 1.6 10*3/uL (ref 0.9–3.3)

## 2011-08-24 LAB — CANCER ANTIGEN 27.29: CA 27.29: 20 U/mL (ref 0–39)

## 2011-08-24 LAB — COMPREHENSIVE METABOLIC PANEL
ALT: 13 U/L (ref 0–35)
AST: 17 U/L (ref 0–37)
Albumin: 4.8 g/dL (ref 3.5–5.2)
Alkaline Phosphatase: 106 U/L (ref 39–117)
BUN: 15 mg/dL (ref 6–23)
CO2: 28 mEq/L (ref 19–32)
Calcium: 9.4 mg/dL (ref 8.4–10.5)
Chloride: 103 mEq/L (ref 96–112)
Creatinine, Ser: 0.78 mg/dL (ref 0.50–1.10)
Glucose, Bld: 83 mg/dL (ref 70–99)
Potassium: 4.1 mEq/L (ref 3.5–5.3)
Sodium: 142 mEq/L (ref 135–145)
Total Bilirubin: 0.7 mg/dL (ref 0.3–1.2)
Total Protein: 7.2 g/dL (ref 6.0–8.3)

## 2011-08-24 LAB — VITAMIN D 25 HYDROXY (VIT D DEFICIENCY, FRACTURES): Vit D, 25-Hydroxy: 35 ng/mL (ref 30–89)

## 2011-08-30 ENCOUNTER — Encounter (HOSPITAL_BASED_OUTPATIENT_CLINIC_OR_DEPARTMENT_OTHER): Payer: BC Managed Care – PPO | Admitting: Oncology

## 2011-08-30 DIAGNOSIS — C50919 Malignant neoplasm of unspecified site of unspecified female breast: Secondary | ICD-10-CM

## 2011-11-23 ENCOUNTER — Encounter (INDEPENDENT_AMBULATORY_CARE_PROVIDER_SITE_OTHER): Payer: Self-pay | Admitting: Surgery

## 2012-01-11 ENCOUNTER — Ambulatory Visit (INDEPENDENT_AMBULATORY_CARE_PROVIDER_SITE_OTHER): Payer: BC Managed Care – PPO | Admitting: Surgery

## 2012-01-11 ENCOUNTER — Other Ambulatory Visit (INDEPENDENT_AMBULATORY_CARE_PROVIDER_SITE_OTHER): Payer: Self-pay | Admitting: Surgery

## 2012-01-11 DIAGNOSIS — E041 Nontoxic single thyroid nodule: Secondary | ICD-10-CM

## 2012-01-23 ENCOUNTER — Other Ambulatory Visit: Payer: Self-pay | Admitting: Oncology

## 2012-01-23 DIAGNOSIS — Z9013 Acquired absence of bilateral breasts and nipples: Secondary | ICD-10-CM

## 2012-01-23 DIAGNOSIS — Z1231 Encounter for screening mammogram for malignant neoplasm of breast: Secondary | ICD-10-CM

## 2012-02-09 ENCOUNTER — Other Ambulatory Visit: Payer: BC Managed Care – PPO

## 2012-02-15 ENCOUNTER — Encounter (INDEPENDENT_AMBULATORY_CARE_PROVIDER_SITE_OTHER): Payer: Self-pay | Admitting: Surgery

## 2012-02-15 ENCOUNTER — Ambulatory Visit
Admission: RE | Admit: 2012-02-15 | Discharge: 2012-02-15 | Disposition: A | Discharge status: Home / Self Care | Payer: PRIVATE HEALTH INSURANCE | Source: Ambulatory Visit | Attending: Surgery | Admitting: Surgery

## 2012-02-15 DIAGNOSIS — E041 Nontoxic single thyroid nodule: Secondary | ICD-10-CM

## 2012-02-19 ENCOUNTER — Encounter (INDEPENDENT_AMBULATORY_CARE_PROVIDER_SITE_OTHER): Payer: Self-pay | Admitting: Surgery

## 2012-02-19 ENCOUNTER — Ambulatory Visit (INDEPENDENT_AMBULATORY_CARE_PROVIDER_SITE_OTHER): Payer: PRIVATE HEALTH INSURANCE | Admitting: Surgery

## 2012-02-19 ENCOUNTER — Other Ambulatory Visit (INDEPENDENT_AMBULATORY_CARE_PROVIDER_SITE_OTHER): Payer: Self-pay | Admitting: Surgery

## 2012-02-19 VITALS — BP 116/74 | HR 66 | Temp 97.8°F | Resp 18 | Ht 60.0 in | Wt 139.6 lb

## 2012-02-19 DIAGNOSIS — E042 Nontoxic multinodular goiter: Secondary | ICD-10-CM

## 2012-02-19 DIAGNOSIS — E041 Nontoxic single thyroid nodule: Secondary | ICD-10-CM

## 2012-02-19 NOTE — Progress Notes (Signed)
Visit Diagnoses: 1. Multinodular goiter (nontoxic), dominant right nodule     HISTORY: Patient is a 41 year old white female with a history of thyroid nodule detected on PET scanning. She was last seen to 14 months ago. At that time she underwent ultrasound and ultrasound-guided fine needle aspiration biopsy. Cytopathology results were benign.  At my request she underwent a followup thyroid ultrasound on February 15, 2012. This shows an enlarged thyroid gland with bilateral small thyroid nodules. The dominant nodule was in the inferior right lobe and measures 2.6 x 1.0 x 1.3 cm. This is slightly decreased in size compared to one year ago.  PERTINENT REVIEW OF SYSTEMS: No new masses. No compressive symptoms. No palpitations. No tremors.  EXAM: HEENT: normocephalic; pupils equal and reactive; sclerae clear; dentition good; mucous membranes moist NECK:  Subtle right inferior thyroid nodule; symmetric on extension; no palpable anterior or posterior cervical lymphadenopathy; no supraclavicular masses; no tenderness CHEST: clear to auscultation bilaterally without rales, rhonchi, or wheezes CARDIAC: regular rate and rhythm without significant murmur; peripheral pulses are full EXT:  non-tender without edema; no deformity NEURO: no gross focal deficits; no sign of tremor   IMPRESSION: Right thyroid nodule, 2.6 cm, clinically stable, history of benign cytopathology  PLAN: The patient I reviewed the above information. I would like to get a TSH level checked today. We will perform a followup thyroid ultrasound in one year at [facility].  The patient and I reviewed all of the above information. At this point the nodule is clinically stable. There is no indication that this is malignant other than the finding on PET scan. I think it is safe to continue to monitor this nodule. At this point the patient does not wish surgical intervention.  Patient will return for followup in one year.  Velora Heckler, MD, FACS General & Endocrine Surgery [facility]

## 2012-02-20 LAB — TSH: TSH: 1.71 u[IU]/mL (ref 0.450–4.500)

## 2012-02-21 ENCOUNTER — Telehealth (INDEPENDENT_AMBULATORY_CARE_PROVIDER_SITE_OTHER): Payer: Self-pay

## 2012-02-21 NOTE — Progress Notes (Signed)
Patient aware of lab results.

## 2012-02-21 NOTE — Telephone Encounter (Signed)
error 

## 2012-02-22 ENCOUNTER — Ambulatory Visit
Admission: RE | Admit: 2012-02-22 | Discharge: 2012-02-22 | Disposition: A | Discharge status: Home / Self Care | Payer: PRIVATE HEALTH INSURANCE | Source: Ambulatory Visit | Attending: Oncology | Admitting: Oncology

## 2012-02-22 DIAGNOSIS — Z1231 Encounter for screening mammogram for malignant neoplasm of breast: Secondary | ICD-10-CM

## 2012-02-22 DIAGNOSIS — Z9013 Acquired absence of bilateral breasts and nipples: Secondary | ICD-10-CM

## 2012-06-05 ENCOUNTER — Other Ambulatory Visit: Payer: Self-pay | Admitting: *Deleted

## 2012-06-05 DIAGNOSIS — C50919 Malignant neoplasm of unspecified site of unspecified female breast: Secondary | ICD-10-CM

## 2012-06-05 DIAGNOSIS — C50912 Malignant neoplasm of unspecified site of left female breast: Secondary | ICD-10-CM | POA: Insufficient documentation

## 2012-06-05 DIAGNOSIS — E042 Nontoxic multinodular goiter: Secondary | ICD-10-CM

## 2012-06-05 NOTE — Progress Notes (Signed)
This RN spoke with pt per her call relating to notable hair loss. Concern is related more to known thyroid goiter " not cancer".  Per discussion Christie Burton has also started new diet with use of soy protein. Above discussed related to possible causes of hair loss.  Plan at present is to obtain labs to evaluate thyroid function.  If normal Christie Burton with change diet and monitor.  Appointment made for 6/27.

## 2012-06-06 ENCOUNTER — Ambulatory Visit: Payer: PRIVATE HEALTH INSURANCE | Admitting: Lab

## 2012-06-06 ENCOUNTER — Other Ambulatory Visit: Payer: Self-pay | Admitting: *Deleted

## 2012-06-06 DIAGNOSIS — L658 Other specified nonscarring hair loss: Secondary | ICD-10-CM | POA: Insufficient documentation

## 2012-06-06 DIAGNOSIS — E042 Nontoxic multinodular goiter: Secondary | ICD-10-CM

## 2012-06-06 DIAGNOSIS — C50919 Malignant neoplasm of unspecified site of unspecified female breast: Secondary | ICD-10-CM

## 2012-06-06 LAB — MAGNESIUM: Magnesium: 2.1 mg/dL (ref 1.5–2.5)

## 2012-06-06 LAB — CBC WITH DIFFERENTIAL/PLATELET
BASO%: 0.6 % (ref 0.0–2.0)
Basophils Absolute: 0 10*3/uL (ref 0.0–0.1)
EOS%: 1.7 % (ref 0.0–7.0)
Eosinophils Absolute: 0.1 10*3/uL (ref 0.0–0.5)
HCT: 41.4 % (ref 34.8–46.6)
HGB: 14.1 g/dL (ref 11.6–15.9)
LYMPH%: 40.4 % (ref 14.0–49.7)
MCH: 29.9 pg (ref 25.1–34.0)
MCHC: 34.1 g/dL (ref 31.5–36.0)
MCV: 87.6 fL (ref 79.5–101.0)
MONO#: 0.4 10*3/uL (ref 0.1–0.9)
MONO%: 8.1 % (ref 0.0–14.0)
NEUT#: 2.2 10*3/uL (ref 1.5–6.5)
NEUT%: 49.2 % (ref 38.4–76.8)
Platelets: 180 10*3/uL (ref 145–400)
RBC: 4.73 10*6/uL (ref 3.70–5.45)
RDW: 13.3 % (ref 11.2–14.5)
WBC: 4.5 10*3/uL (ref 3.9–10.3)
lymph#: 1.8 10*3/uL (ref 0.9–3.3)

## 2012-06-06 LAB — COMPREHENSIVE METABOLIC PANEL
ALT: 17 U/L (ref 0–35)
AST: 22 U/L (ref 0–37)
Albumin: 4.9 g/dL (ref 3.5–5.2)
Alkaline Phosphatase: 94 U/L (ref 39–117)
BUN: 15 mg/dL (ref 6–23)
CO2: 28 mEq/L (ref 19–32)
Calcium: 9.5 mg/dL (ref 8.4–10.5)
Chloride: 100 mEq/L (ref 96–112)
Creatinine, Ser: 0.76 mg/dL (ref 0.50–1.10)
Glucose, Bld: 83 mg/dL (ref 70–99)
Potassium: 4 mEq/L (ref 3.5–5.3)
Sodium: 137 mEq/L (ref 135–145)
Total Bilirubin: 0.8 mg/dL (ref 0.3–1.2)
Total Protein: 7.3 g/dL (ref 6.0–8.3)

## 2012-06-06 LAB — FERRITIN: Ferritin: 206 ng/mL (ref 10–291)

## 2012-06-06 LAB — TSH: TSH: 1.401 u[IU]/mL (ref 0.350–4.500)

## 2012-06-06 LAB — T4: T4, Total: 8.6 ug/dL (ref 5.0–12.5)

## 2012-06-06 LAB — VITAMIN B12: Vitamin B-12: 460 pg/mL (ref 211–911)

## 2012-06-06 LAB — PHOSPHORUS: Phosphorus: 3.4 mg/dL (ref 2.3–4.6)

## 2012-07-26 ENCOUNTER — Telehealth: Payer: Self-pay | Admitting: Oncology

## 2012-07-26 NOTE — Telephone Encounter (Signed)
lmonvm for pt re appts for 10/2 and 10/9 and mailed schedule

## 2012-09-11 ENCOUNTER — Other Ambulatory Visit: Payer: PRIVATE HEALTH INSURANCE | Admitting: Lab

## 2012-09-18 ENCOUNTER — Ambulatory Visit: Payer: PRIVATE HEALTH INSURANCE | Admitting: Oncology

## 2012-09-24 ENCOUNTER — Other Ambulatory Visit: Payer: Self-pay | Admitting: Oncology

## 2012-09-24 ENCOUNTER — Other Ambulatory Visit (HOSPITAL_BASED_OUTPATIENT_CLINIC_OR_DEPARTMENT_OTHER): Payer: PRIVATE HEALTH INSURANCE | Admitting: Lab

## 2012-09-24 DIAGNOSIS — C50919 Malignant neoplasm of unspecified site of unspecified female breast: Secondary | ICD-10-CM

## 2012-09-24 LAB — CBC WITH DIFFERENTIAL/PLATELET
BASO%: 0.7 % (ref 0.0–2.0)
Basophils Absolute: 0 10*3/uL (ref 0.0–0.1)
EOS%: 2.1 % (ref 0.0–7.0)
Eosinophils Absolute: 0.1 10*3/uL (ref 0.0–0.5)
HCT: 38.5 % (ref 34.8–46.6)
HGB: 13.5 g/dL (ref 11.6–15.9)
LYMPH%: 41.6 % (ref 14.0–49.7)
MCH: 30.3 pg (ref 25.1–34.0)
MCHC: 35 g/dL (ref 31.5–36.0)
MCV: 86.5 fL (ref 79.5–101.0)
MONO#: 0.4 10*3/uL (ref 0.1–0.9)
MONO%: 8.2 % (ref 0.0–14.0)
NEUT#: 2.3 10*3/uL (ref 1.5–6.5)
NEUT%: 47.4 % (ref 38.4–76.8)
Platelets: 164 10*3/uL (ref 145–400)
RBC: 4.45 10*6/uL (ref 3.70–5.45)
RDW: 12.8 % (ref 11.2–14.5)
WBC: 4.9 10*3/uL (ref 3.9–10.3)
lymph#: 2 10*3/uL (ref 0.9–3.3)

## 2012-09-24 LAB — COMPREHENSIVE METABOLIC PANEL (CC13)
ALT: 14 U/L (ref 0–55)
AST: 16 U/L (ref 5–34)
Albumin: 4.4 g/dL (ref 3.5–5.0)
Alkaline Phosphatase: 86 U/L (ref 40–150)
BUN: 15 mg/dL (ref 7.0–26.0)
CO2: 27 mEq/L (ref 22–29)
Calcium: 9.7 mg/dL (ref 8.4–10.4)
Chloride: 101 mEq/L (ref 98–107)
Creatinine: 0.8 mg/dL (ref 0.6–1.1)
Glucose: 80 mg/dl (ref 70–99)
Potassium: 4.1 mEq/L (ref 3.5–5.1)
Sodium: 139 mEq/L (ref 136–145)
Total Bilirubin: 0.7 mg/dL (ref 0.20–1.20)
Total Protein: 6.9 g/dL (ref 6.4–8.3)

## 2012-10-01 ENCOUNTER — Ambulatory Visit (HOSPITAL_BASED_OUTPATIENT_CLINIC_OR_DEPARTMENT_OTHER): Payer: PRIVATE HEALTH INSURANCE | Admitting: Oncology

## 2012-10-01 ENCOUNTER — Telehealth: Payer: Self-pay | Admitting: *Deleted

## 2012-10-01 VITALS — BP 106/73 | HR 86 | Temp 98.4°F | Resp 20 | Ht 60.0 in | Wt 141.6 lb

## 2012-10-01 DIAGNOSIS — Z1501 Genetic susceptibility to malignant neoplasm of breast: Secondary | ICD-10-CM

## 2012-10-01 DIAGNOSIS — Z9010 Acquired absence of unspecified breast and nipple: Secondary | ICD-10-CM

## 2012-10-01 DIAGNOSIS — C50919 Malignant neoplasm of unspecified site of unspecified female breast: Secondary | ICD-10-CM

## 2012-10-01 NOTE — Progress Notes (Signed)
ID: Christie Burton   DOB: 11-02-1971  MR#: [id number]  WUJ#:[id number]  PCP: Christie James MD GYN: Christie Medici MD SU: Christie Level, MD OTHER MD: Christie Burton   HISTORY OF PRESENT ILLNESS: Christie Burton was around 16 weeks of her 7th pregnancy when she noted a lump in her left breast. She brought this to the attention of Dr. Janelle Floor Burton's office.  [facility], where baseline mammogram was performed November 1st.  This showed extremely dense fibrous tissue bilaterally without dominant masses.    Ultrasound of the breast on the left showed the palpable mass at 3 o'clock, 1 cm from the nipple, to be hypoechoic, with lobulated borders.  The left axilla showed no enlarged adenopathy.  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  There was no evidence of lymphovascular invasion and no in situ component. By ultrasound, the mass measured approximately 2.1 cm.  The patient's subsequent history is as detailed below  INTERVAL HISTORY: Christie Burton returns today with her son Christie Burton for followup of her remote breast cancer. Interval history is unremarkable. She recently traveled to [place] with her father and sister. She is very busy home schooling their children so she is not exercising as much as she normally does.  REVIEW OF SYSTEMS: She has rare headaches which are not unusual a persistent or intense. She has developed a couple fatty tumors. Overall though a detailed review of systems today was entirely negative  PAST MEDICAL HISTORY: Past Medical History  Diagnosis Date  . Cancer     BREAST  . Thyroid nodule     PAST SURGICAL HISTORY: Past Surgical History  Procedure Date  . Tram 2006    WITH RECONSTRUCTION  . Breast lumpectomy 2005  . Abdominal hysterectomy 2006    FAMILY HISTORY No family history on file. The patient's father lives in [place].  The patient's mother lives in  [place]. Christie Burton has one brother and one sister. There is no history of breast or ovarian cancer in the immediate family.  GYNECOLOGIC HISTORY: The patient had pregnancies of which she was able to carry four to term.  She had three miscarriages.    SOCIAL HISTORY: (updated OCT 2013) The patient is a homemaker.  Her husband, Christie Burton, works as a Development worker, community.  He is originally from [place].  Christie Burton comes from a [employer] background and they attend the [employer].  Christie Burton has a Education administrator in school psychology.  Their children are two daughters aged 52 and 4 and two sons aged 63 and 13. They are being home-schooled ADVANCED DIRECTIVES:  HEALTH MAINTENANCE: History  Substance Use Topics  . Smoking status: Never Smoker   . Smokeless tobacco: Never Used  . Alcohol Use: Yes     2 per week     Colonoscopy:  PAP:  Bone density:  Lipid panel:  No Known Allergies  Current Outpatient Prescriptions  Medication Sig Dispense Refill  . clobetasol (TEMOVATE) 0.05 % external solution       . Fluocinolone Acetonide 0.01 % OIL       . SUMAtriptan (IMITREX) 50 MG tablet         OBJECTIVE: Middle-aged white woman who appears well Filed Vitals:   10/01/12 1440  BP: 106/73  Pulse: 86  Temp: 98.4 F (36.9 C)  Resp: 20     Body mass index is 27.65 kg/(m^2).  ECOG FS: 0  Sclerae unicteric Oropharynx clear No cervical or supraclavicular adenopathy Lungs no rales or rhonchi Heart regular rate and rhythm Abd benign MSK no focal spinal tenderness, no peripheral edema Neuro: nonfocal Breasts: Status post bilateral mastectomies with TRAM reconstruction. There is no evidence of local recurrence. Both axillae are benign.   LAB RESULTS: Lab Results  Component Value Date   WBC 4.9 09/24/2012   NEUTROABS 2.3 09/24/2012   HGB 13.5 09/24/2012   HCT 38.5 09/24/2012   MCV 86.5 09/24/2012   PLT 164 09/24/2012      Chemistry      Component Value Date/Time   NA 139  09/24/2012 1407   NA 137 06/06/2012 1255   K 4.1 09/24/2012 1407   K 4.0 06/06/2012 1255   CL 101 09/24/2012 1407   CL 100 06/06/2012 1255   CO2 27 09/24/2012 1407   CO2 28 06/06/2012 1255   BUN 15.0 09/24/2012 1407   BUN 15 06/06/2012 1255   CREATININE 0.8 09/24/2012 1407   CREATININE 0.76 06/06/2012 1255      Component Value Date/Time   CALCIUM 9.7 09/24/2012 1407   CALCIUM 9.5 06/06/2012 1255   ALKPHOS 86 09/24/2012 1407   ALKPHOS 94 06/06/2012 1255   AST 16 09/24/2012 1407   AST 22 06/06/2012 1255   ALT 14 09/24/2012 1407   ALT 17 06/06/2012 1255   BILITOT 0.70 09/24/2012 1407   BILITOT 0.8 06/06/2012 1255       Lab Results  Component Value Date   LABCA2 20 08/23/2011   LABCA2 20 08/23/2011    No components found with this basename: [id number]    No results found for this basename: INR:1;PROTIME:1 in the last 168 hours  Urinalysis No results found for this basename: colorurine, appearanceur, labspec, phurine, glucoseu, hgbur, bilirubinur, ketonesur, proteinur, urobilinogen, nitrite, leukocytesur    STUDIES: Mammography March 2013 was unremarkable  ASSESSMENT: 40 y.o. BRCA-1 positive [place] woman status post left lumpectomy and sentinel lymph node biopsy November 2005 for a T2 N0 grade 3 invasive ductal carcinoma treated adjuvant with Cytoxan, Adriamycin and 5-FU every 3 weeks during her pregnancy, then Taxol weekly x12 after delivery.  All chemotherapy completed August 2006 at which time she had bilateral mastectomies with TRAM reconstruction, showing no residual tumor.  She has also undergone bilateral salpingo-oophorectomy and hysterectomy as of May 2006.    PLAN: Christie Burton is doing terrific from a breast cancer point of view. We are going to see her 2 more times, and 2014 and 2015 and then likely we will discharge her from followup here. She knows to call for any problems that may develop before that visit.   Christie Burton C    10/01/2012

## 2012-10-01 NOTE — Telephone Encounter (Signed)
Gave patient appointment for 10-01-2013 starting at 2:45pm

## 2013-01-14 ENCOUNTER — Other Ambulatory Visit: Payer: PRIVATE HEALTH INSURANCE

## 2013-02-17 ENCOUNTER — Ambulatory Visit
Admission: RE | Admit: 2013-02-17 | Discharge: 2013-02-17 | Disposition: A | Discharge status: Home / Self Care | Payer: BC Managed Care – PPO | Source: Ambulatory Visit | Attending: Surgery | Admitting: Surgery

## 2013-02-17 DIAGNOSIS — E041 Nontoxic single thyroid nodule: Secondary | ICD-10-CM

## 2013-02-21 ENCOUNTER — Encounter (INDEPENDENT_AMBULATORY_CARE_PROVIDER_SITE_OTHER): Payer: Self-pay | Admitting: Surgery

## 2013-02-21 ENCOUNTER — Ambulatory Visit (INDEPENDENT_AMBULATORY_CARE_PROVIDER_SITE_OTHER): Payer: BC Managed Care – PPO | Admitting: Surgery

## 2013-02-21 VITALS — BP 100/62 | HR 78 | Resp 18 | Ht 60.0 in | Wt 145.0 lb

## 2013-02-21 DIAGNOSIS — E042 Nontoxic multinodular goiter: Secondary | ICD-10-CM

## 2013-02-21 NOTE — Progress Notes (Signed)
General Surgery [facility]  Visit Diagnoses: 1. Multinodular goiter (nontoxic), dominant right nodule     HISTORY: Patient is a 42 year old white female followed for bilateral thyroid nodules. She has a dominant nodule on the right side which was detected on PET scanning. Previous fine-needle aspiration cytology was negative. Recent TSH level is normal at 1.401 and she has never been on thyroid hormone.  At my request she underwent a followup thyroid ultrasound on 02/17/2013. This showed a slightly enlarged thyroid gland with multiple bilateral nodules. The dominant nodule in the inferior right lobe measures 13 x 16 x 24 mm and is complex, although it is mostly solid. This is stable compared to her study from March of 2013.  PERTINENT REVIEW OF SYSTEMS: The patient denies tremor. Denies palpitations. Denies compressive symptoms. She does note persistent hoarseness.  EXAM: HEENT: normocephalic; pupils equal and reactive; sclerae clear; dentition good; mucous membranes moist NECK:  Subtle palpable nodule inferior right thyroid lobe, mobile with swallowing; symmetric on extension; no palpable anterior or posterior cervical lymphadenopathy; no supraclavicular masses; no tenderness CHEST: clear to auscultation bilaterally without rales, rhonchi, or wheezes CARDIAC: regular rate and rhythm without significant murmur; peripheral pulses are full EXT:  non-tender without edema; no deformity NEURO: no gross focal deficits; no sign of tremor   IMPRESSION: #1 multinodular thyroid goiter, small, asymptomatic #2 dominant right thyroid nodule, 2.4 cm, clinically stable  PLAN: The patient and I discussed the above findings. There is no indication for surgical resection at this point. I think this can continue to be safely monitored. Given the fact that she has had a positive PET scan, I think we should repeat her ultrasound in one year. We will also check her TSH level at that  time.  Patient complains of chronic hoarseness. This does not appear to be seasonal. I do not think it is related to her thyroid nodules. I have offered referral to EENT for direct laryngoscopy. I also encouraged her to try over-the-counter antacids to see if this improves her voice quality. She will contact me if she desires referral to ENT for recent evaluation.  Patient will return in 1 year for follow-up.  Velora Heckler, MD, [facility] Office: [phone]

## 2013-02-21 NOTE — Addendum Note (Signed)
Addended by: Ethlyn Gallery on: 02/21/2013 02:21 PM   Modules accepted: Orders

## 2013-02-21 NOTE — Patient Instructions (Signed)
Hoarseness  Hoarseness is produced from a variety of causes. It is important to find the cause so it can be treated. In the absence of a cold or upper respiratory illness, any hoarseness lasting more than 2 weeks should be looked at by a specialist. This is especially important if you have a history of smoking or alcohol use. It is also important to keep in mind that as you grow older, your voice will naturally get weaker, making it easier for you to become hoarse from straining your vocal cords.   CAUSES   Any illness that affects your vocal cords can result in a hoarse voice. Examples of conditions that can affect the vocal cords are listed as follows:    Allergies.   Colds.   Sinusitis.   Gastroesophageal reflux disease.   Croup.   Injury.   Nodules.   Exposure to smoke or toxic fumes or gases.   Congenital and genetic defects.   Paralysis of the vocal cords.   Infections.   Advanced age.  DIAGNOSIS   In order to diagnose the cause of your hoarseness, your caregiver will examine your throat using an instrument that uses a tube with a small lighted camera (laryngoscope). It allows your caregiver to look into the mouth and down the throat.  TREATMENT   For most cases, treatment will focus on the specific cause of the hoarseness. Depending on the cause, hoarseness can be a temporary condition (acute) or it can be long lasting (chronic). Most cases of hoarseness clear up without complications. Your caregiver will explain to you if this is not likely to happen.  SEEK IMMEDIATE MEDICAL CARE IF:    You have increasing hoarseness or loss of voice.   You have shortness of breath.   You are coughing up blood.   There is pain in your neck or throat.  Document Released: 11/10/2005 Document Revised: 02/19/2012 Document Reviewed: 02/02/2011  ExitCare Patient Information 2013 [employer].

## 2013-09-30 ENCOUNTER — Other Ambulatory Visit: Payer: Self-pay | Admitting: Physician Assistant

## 2013-09-30 DIAGNOSIS — C50919 Malignant neoplasm of unspecified site of unspecified female breast: Secondary | ICD-10-CM

## 2013-10-01 ENCOUNTER — Encounter: Payer: Self-pay | Admitting: Physician Assistant

## 2013-10-01 ENCOUNTER — Telehealth: Payer: Self-pay | Admitting: Oncology

## 2013-10-01 ENCOUNTER — Other Ambulatory Visit (HOSPITAL_BASED_OUTPATIENT_CLINIC_OR_DEPARTMENT_OTHER): Payer: BC Managed Care – PPO | Admitting: Lab

## 2013-10-01 ENCOUNTER — Ambulatory Visit (HOSPITAL_BASED_OUTPATIENT_CLINIC_OR_DEPARTMENT_OTHER): Payer: BC Managed Care – PPO | Admitting: Physician Assistant

## 2013-10-01 VITALS — BP 125/85 | HR 76 | Temp 98.9°F | Resp 18 | Ht 60.0 in | Wt 155.4 lb

## 2013-10-01 DIAGNOSIS — C50919 Malignant neoplasm of unspecified site of unspecified female breast: Secondary | ICD-10-CM

## 2013-10-01 DIAGNOSIS — Z1231 Encounter for screening mammogram for malignant neoplasm of breast: Secondary | ICD-10-CM

## 2013-10-01 DIAGNOSIS — Z853 Personal history of malignant neoplasm of breast: Secondary | ICD-10-CM

## 2013-10-01 DIAGNOSIS — Z1501 Genetic susceptibility to malignant neoplasm of breast: Secondary | ICD-10-CM

## 2013-10-01 DIAGNOSIS — G43909 Migraine, unspecified, not intractable, without status migrainosus: Secondary | ICD-10-CM | POA: Insufficient documentation

## 2013-10-01 DIAGNOSIS — C50912 Malignant neoplasm of unspecified site of left female breast: Secondary | ICD-10-CM

## 2013-10-01 LAB — CBC WITH DIFFERENTIAL/PLATELET
BASO%: 0.8 % (ref 0.0–2.0)
Basophils Absolute: 0 10*3/uL (ref 0.0–0.1)
EOS%: 1.6 % (ref 0.0–7.0)
Eosinophils Absolute: 0.1 10*3/uL (ref 0.0–0.5)
HCT: 41.4 % (ref 34.8–46.6)
HGB: 14.2 g/dL (ref 11.6–15.9)
LYMPH%: 36.9 % (ref 14.0–49.7)
MCH: 29.1 pg (ref 25.1–34.0)
MCHC: 34.2 g/dL (ref 31.5–36.0)
MCV: 85.1 fL (ref 79.5–101.0)
MONO#: 0.4 10*3/uL (ref 0.1–0.9)
MONO%: 7.3 % (ref 0.0–14.0)
NEUT#: 3 10*3/uL (ref 1.5–6.5)
NEUT%: 53.4 % (ref 38.4–76.8)
Platelets: 186 10*3/uL (ref 145–400)
RBC: 4.87 10*6/uL (ref 3.70–5.45)
RDW: 13 % (ref 11.2–14.5)
WBC: 5.6 10*3/uL (ref 3.9–10.3)
lymph#: 2.1 10*3/uL (ref 0.9–3.3)

## 2013-10-01 LAB — COMPREHENSIVE METABOLIC PANEL (CC13)
ALT: 12 U/L (ref 0–55)
AST: 16 U/L (ref 5–34)
Albumin: 4.1 g/dL (ref 3.5–5.0)
Alkaline Phosphatase: 108 U/L (ref 40–150)
Anion Gap: 11 mEq/L (ref 3–11)
BUN: 12.5 mg/dL (ref 7.0–26.0)
CO2: 26 mEq/L (ref 22–29)
Calcium: 9.5 mg/dL (ref 8.4–10.4)
Chloride: 104 mEq/L (ref 98–109)
Creatinine: 0.8 mg/dL (ref 0.6–1.1)
Glucose: 89 mg/dl (ref 70–140)
Potassium: 3.8 mEq/L (ref 3.5–5.1)
Sodium: 140 mEq/L (ref 136–145)
Total Bilirubin: 0.63 mg/dL (ref 0.20–1.20)
Total Protein: 7.8 g/dL (ref 6.4–8.3)

## 2013-10-01 MED ORDER — SUMATRIPTAN SUCCINATE 50 MG PO TABS: 50.0000 mg | ORAL_TABLET | ORAL | Status: DC | PRN

## 2013-10-01 NOTE — Telephone Encounter (Signed)
, °

## 2013-10-01 NOTE — Progress Notes (Signed)
ID: Christie Burton   DOB: 1971-07-17  MR#: [id number]  WUJ#:[id number]  PCP: Deatra James MD GYN: Teodora Medici MD SU: Darnell Level, MD OTHER MD: Elmon Else  CHIEF COMPLAINTS:  1)  Left Breast Cancer      2)   BRCA 1 positive   HISTORY OF PRESENT ILLNESS: Christie Burton was around 16 weeks of her 7th pregnancy when she noted a lump in her left breast. She brought this to the attention of Dr. Janelle Floor Dillard's office.  She was [facility], where baseline mammogram was performed November 1st.  This showed extremely dense fibrous tissue bilaterally without dominant masses.    Ultrasound of the breast on the left showed the palpable mass at 3 o'clock, 1 cm from the nipple, to be hypoechoic, with lobulated borders.  The left axilla showed no enlarged adenopathy.  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  There was no evidence of lymphovascular invasion and no in situ component. By ultrasound, the mass measured approximately 2.1 cm.    The patient's subsequent history is as detailed below  INTERVAL HISTORY: Christie Burton returns alone today for followup of her remote left breast cancer. Interval history is generally unremarkable and Christie Burton has no new complaints today.   Christie Burton continues to be very busy with her family.  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  She admits that she is not exercising as much as she would like.  REVIEW OF SYSTEMS: Christie Burton has had no recent illnesses and denies any fevers, chills, or night sweats. Her Christie level is good, as is her appetite. She's had no nausea and denies any change in bowel or bladder habits. She has no new cough, increased shortness of breath, chest pain, palpitations. She has occasional migraines for which she utilizes Imitrex effectively, and she requests a refill on this today. This is a chronic problem and  has not worsened. She denies any dizziness or change in vision. She denies any additional pain, no unusual myalgias, arthralgias, or bony pain, and has had no peripheral swelling.  A detailed review of systems is otherwise stable and noncontributory.   PAST MEDICAL HISTORY: Past Medical History  Diagnosis Date  . Cancer     BREAST  . Thyroid nodule     PAST SURGICAL HISTORY: Past Surgical History  Procedure Laterality Date  . Tram  2006    WITH RECONSTRUCTION  . Breast lumpectomy  2005  . Abdominal hysterectomy  2006    FAMILY HISTORY No family history on file. The patient's father lives in [place].  The patient's mother lives in [place]. Christie Burton has one brother and one sister. There is no history of breast or ovarian cancer in the immediate family.  GYNECOLOGIC HISTORY: (Updated 10/01/2013) The patient had 7 pregnancies of which she was able to carry 4 to term.  She had three miscarriages.  She status post hysterectomy and bilateral salpingo-oophorectomy in May 2006.  SOCIAL HISTORY: (updated 10/01/2013) The patient is a homemaker.  Her husband, Christie Burton, works as a Development worker, community.  He is originally from [place].  Christie Burton comes from a [employer] background and they attend the Christie Burton.  Tandra has a Education administrator in school psychology.  Their children are two daughters aged 74 and 26 and two sons aged 13 and 75. They are being home-schooled   ADVANCED DIRECTIVES:  HEALTH  MAINTENANCE: (Updated 10/01/2013) History  Substance Use Topics  . Smoking status: Never Smoker   . Smokeless tobacco: Never Used  . Alcohol Use: Yes     Comment: 2 per week     Colonoscopy:  Never  PAP:  Due, Dr. Chevis Burton  Bone density: Never  Lipid panel: Due, Dr Wynelle Burton    No Known Allergies  Current Outpatient Prescriptions  Medication Sig Dispense Refill  . clobetasol (TEMOVATE) 0.05 % external solution       . Fluocinolone Acetonide 0.01 % OIL       . SUMAtriptan (IMITREX)  50 MG tablet Take 1 tablet (50 mg total) by mouth every 2 (two) hours as needed for migraine.  10 tablet  1   No current facility-administered medications for this visit.    OBJECTIVE: Middle-aged white woman who appears well and is in no acute distress Filed Vitals:   10/01/13 1537  BP: 125/85  Pulse: 76  Temp: 98.9 F (37.2 C)  Resp: 18     Body mass index is 30.35 kg/(m^2).    ECOG FS: 0 Filed Weights   10/01/13 1537  Weight: 155 lb 6.4 oz (70.489 kg)   Physical Exam: HEENT:  Sclerae anicteric.  Oropharynx clear.  NODES:  No cervical or supraclavicular lymphadenopathy palpated.  BREAST EXAM: Patient status post bilateral mastectomies with TRAM reconstruction. No evidence of local recurrence. Axillae are benign bilaterally palpable lymphadenopathy. LUNGS: Clear to auscultation bilaterally.  No wheezes or rhonchi HEART:  Regular rate and rhythm. No murmur  appreciated.  ABDOMEN:  Soft, nontender.  Positive bowel sounds.  MSK:  No focal spinal tenderness to palpation.  full range of motion in the upper extremities  EXTREMITIES:  No peripheral edema.   NEURO:  Nonfocal. Well oriented.   Positive affect.    LAB RESULTS: Lab Results  Component Value Date   WBC 5.6 10/01/2013   NEUTROABS 3.0 10/01/2013   HGB 14.2 10/01/2013   HCT 41.4 10/01/2013   MCV 85.1 10/01/2013   PLT 186 10/01/2013      Chemistry      Component Value Date/Time   NA 140 10/01/2013 1419   NA 137 06/06/2012 1255   K 3.8 10/01/2013 1419   K 4.0 06/06/2012 1255   CL 101 09/24/2012 1407   CL 100 06/06/2012 1255   CO2 26 10/01/2013 1419   CO2 28 06/06/2012 1255   BUN 12.5 10/01/2013 1419   BUN 15 06/06/2012 1255   CREATININE 0.8 10/01/2013 1419   CREATININE 0.76 06/06/2012 1255      Component Value Date/Time   CALCIUM 9.5 10/01/2013 1419   CALCIUM 9.5 06/06/2012 1255   ALKPHOS 108 10/01/2013 1419   ALKPHOS 94 06/06/2012 1255   AST 16 10/01/2013 1419   AST 22 06/06/2012 1255   ALT 12 10/01/2013 1419    ALT 17 06/06/2012 1255   BILITOT 0.63 10/01/2013 1419   BILITOT 0.8 06/06/2012 1255      STUDIES: Most recent mammogram was 02/23/2012 and was unremarkable. A repeat mammogram was suggested in 12 months, and this is currently past due.     ASSESSMENT: 42 y.o. 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    (3)  All chemotherapy  completed August 2006 at which time she had bilateral mastectomies with TRAM reconstruction, showing no residual tumor.    (4)  She has also undergone bilateral salpingo-oophorectomy and hysterectomy as of May 2006.    PLAN:  With regards to her breast carcinoma, Christie Burton continues to do extremely well with no clinical evidence of disease recurrence. She is due for her annual mammogram and we will get that scheduled for her.  Otherwise, the plan is to see Christie Burton one more time in October of 2015, and if all is well, we will likely discharge her from followup at that time. All of this was reviewed in detail with Christie Burton today. She voices understanding and agreement with this plan, and will call any changes or problems prior to her next scheduled appointment.  I have refilled Christie Burton's Imitrex as requested.   Christie Mallozzi PA-C    10/01/2013

## 2014-01-28 ENCOUNTER — Encounter (INDEPENDENT_AMBULATORY_CARE_PROVIDER_SITE_OTHER): Payer: Self-pay | Admitting: Surgery

## 2014-03-17 ENCOUNTER — Telehealth (INDEPENDENT_AMBULATORY_CARE_PROVIDER_SITE_OTHER): Payer: Self-pay

## 2014-03-17 NOTE — Telephone Encounter (Signed)
LMOM that ultrasound and TSH due prior to appt. I noted that pt was to have u/s in March but not in epic. Pt may need to r/s appt.

## 2014-03-20 NOTE — Telephone Encounter (Signed)
Pt called.  She has the order for her blood work to be done that she was given at her last appt.   She needs a order for [place] to be done.  Please advise!  Anderson Malta

## 2014-03-23 ENCOUNTER — Ambulatory Visit (INDEPENDENT_AMBULATORY_CARE_PROVIDER_SITE_OTHER): Payer: BC Managed Care – PPO | Admitting: Surgery

## 2014-03-23 NOTE — Telephone Encounter (Signed)
Time for her one year follow up.  Needs to have a TSH level and a thyroid ultrasound done.  Then make appt with me in the office.  Earnstine Regal, MD, [facility] Office: [phone]

## 2014-03-25 ENCOUNTER — Other Ambulatory Visit (INDEPENDENT_AMBULATORY_CARE_PROVIDER_SITE_OTHER): Payer: Self-pay

## 2014-03-25 DIAGNOSIS — E042 Nontoxic multinodular goiter: Secondary | ICD-10-CM

## 2014-03-30 ENCOUNTER — Telehealth (INDEPENDENT_AMBULATORY_CARE_PROVIDER_SITE_OTHER): Payer: Self-pay

## 2014-03-30 NOTE — Telephone Encounter (Signed)
LMOM. Orders for u/s in epic. Pt can call [phone] to set up u/s.Lab slip mailed to pt. Also advised pt that first avail appt with [facility] will be June.

## 2014-04-01 ENCOUNTER — Telehealth (INDEPENDENT_AMBULATORY_CARE_PROVIDER_SITE_OTHER): Payer: Self-pay

## 2014-04-01 ENCOUNTER — Ambulatory Visit
Admission: RE | Admit: 2014-04-01 | Discharge: 2014-04-01 | Disposition: A | Discharge status: Home / Self Care | Payer: 59 | Source: Ambulatory Visit | Attending: Surgery | Admitting: Surgery

## 2014-04-01 DIAGNOSIS — E042 Nontoxic multinodular goiter: Secondary | ICD-10-CM

## 2014-04-01 NOTE — Telephone Encounter (Signed)
U/S of thyroid report reviewed with Dr Harlow Asa. Per Dr Gala Lewandowsky request pt advised via phone that thyroid nodules stable and the one one on right that was biopsed is a bit smaller. Pt will keep June appt for exam. Pt agrees with this plan.

## 2014-04-02 LAB — TSH: TSH: 1.869 u[IU]/mL (ref 0.350–4.500)

## 2014-04-16 ENCOUNTER — Encounter: Payer: Self-pay | Admitting: Podiatry

## 2014-04-16 ENCOUNTER — Ambulatory Visit (INDEPENDENT_AMBULATORY_CARE_PROVIDER_SITE_OTHER): Payer: 59

## 2014-04-16 ENCOUNTER — Ambulatory Visit (INDEPENDENT_AMBULATORY_CARE_PROVIDER_SITE_OTHER): Payer: 59 | Admitting: Podiatry

## 2014-04-16 DIAGNOSIS — R52 Pain, unspecified: Secondary | ICD-10-CM

## 2014-04-16 DIAGNOSIS — M779 Enthesopathy, unspecified: Secondary | ICD-10-CM

## 2014-04-16 MED ORDER — DICLOFENAC SODIUM 75 MG PO TBEC: 75.0000 mg | DELAYED_RELEASE_TABLET | Freq: Two times a day (BID) | ORAL | Status: DC

## 2014-04-16 MED ORDER — TRIAMCINOLONE ACETONIDE 10 MG/ML IJ SUSP
10.0000 mg | Freq: Once | INTRAMUSCULAR | Status: AC
  Administered 2014-04-16: 10 mg

## 2014-04-16 NOTE — Progress Notes (Signed)
   Subjective:    Patient ID: Christie Burton, female    DOB: 1971/11/12, 43 y.o.   MRN: [id number]  HPI PT STATED RFT FOOT 2ND AND LT FOOT TOES ARE PAINFUL AND CURVING IN FOR 6 MONTHS. THE FEET GET AGGRAVATED BY WALKING AND RUNNING AND TRIED NO TREATMENT.    Review of Systems  All other systems reviewed and are negative.      Objective:   Physical Exam        Assessment & Plan:

## 2014-04-17 NOTE — Progress Notes (Signed)
Subjective:     Patient ID: Christie Burton, female   DOB: [birth year], 43 y.o.   MRN: [id number]  Toe Pain    patient states she gets a lot of pain right over left foot and states that she can't say exactly where it coming from but it feels like it around the second toe. Been going on for about 6 months  Review of Systems  All other systems reviewed and are negative.      Objective:   Physical Exam  Nursing note and vitals reviewed. Constitutional: She is oriented to person, place, and time.  Cardiovascular: Intact distal pulses.   Musculoskeletal: Normal range of motion.  Neurological: She is oriented to person, place, and time.  Skin: Skin is warm.   neurovascular status intact with health history unchanged and patient found to have normal range of motion subtalar joint midtarsal joint and normal muscle strength of the evertors and inverters flexors extensors. Digits are well perfused arch height is normal and I noted mild/moderate discomfort around the second metatarsal phalangeal joint right over left lateral side of the joint    Assessment:     Capsulitis most likely of the second MPJ right over left with inflammation mostly in the lateral side of the joint surface    Plan:     H&P and x-rays reviewed and careful injection of the lateral side 3 mg Kenalog dexamethasone and 5 mg Xylocaine right second MPJ lateral side. Reappoint her recheck in 2 weeks

## 2014-05-12 ENCOUNTER — Telehealth: Payer: Self-pay | Admitting: *Deleted

## 2014-05-12 NOTE — Telephone Encounter (Signed)
Pt called to this RN to state onset of fevers over the past few days with cough " and my children have walking pneumonia - so please tell me do I need to see you guys or my primary MD "  This RN informed pt she should see her primary MD for the above concerns.  Christie Burton verbalized understanding and agreement.  No other needs per this office.

## 2014-05-18 ENCOUNTER — Encounter (INDEPENDENT_AMBULATORY_CARE_PROVIDER_SITE_OTHER): Payer: Self-pay | Admitting: Surgery

## 2014-05-18 ENCOUNTER — Ambulatory Visit (INDEPENDENT_AMBULATORY_CARE_PROVIDER_SITE_OTHER): Payer: 59 | Admitting: Surgery

## 2014-05-18 VITALS — BP 126/78 | HR 75 | Temp 97.5°F | Ht 61.0 in | Wt 158.0 lb

## 2014-05-18 DIAGNOSIS — E042 Nontoxic multinodular goiter: Secondary | ICD-10-CM

## 2014-05-18 NOTE — Patient Instructions (Signed)

## 2014-05-18 NOTE — Progress Notes (Signed)
General Surgery [facility]  Chief Complaint  Patient presents with  . Follow-up    bilateral thyroid nodules    HISTORY: Patient is a 43 year old female followed for bilateral thyroid nodules. At my request she underwent a thyroid ultrasound on 04/01/2014. This was compared to prior studies from 2014 in 2012. Right thyroid lobe is slightly enlarged at 5.6 cm. Dominant nodule measures 2.4 cm and is unchanged compared to prior examinations. Left lobe is normal in size at 4.8 cm. Dominant nodule measures 6 mm.  Laboratory studies included a TSH level which is normal at 1.869.  Patient noted some tenderness in the right neck at March 2015. This persisted for several weeks but then resolved. Last year she had noted some mild to moderate hoarseness but this resolved with over-the-counter medications.  PERTINENT REVIEW OF SYSTEMS: Denies tremor. Denies palpitation. Denies compressive symptoms. Denies new masses or lymphadenopathy.  EXAM: HEENT: normocephalic; pupils equal and reactive; sclerae clear; dentition good; mucous membranes moist NECK:  No palpable masses in the thyroid bed; symmetric on extension; no palpable anterior or posterior cervical lymphadenopathy; no supraclavicular masses; no tenderness CHEST: clear to auscultation bilaterally without rales, rhonchi, or wheezes CARDIAC: regular rate and rhythm without significant murmur; peripheral pulses are full EXT:  non-tender without edema; no deformity NEURO: no gross focal deficits; no sign of tremor   IMPRESSION: Bilateral thyroid nodules, clinically stable  PLAN: Patient and I discussed the ultrasound results and the laboratory studies. Everything remained stable and there is no sign of worrisome findings.  Patient will return for physical examination following her next ultrasound in 2 years. I will ask her primary care physician to obtain a TSH level in one year. If her examination remains completely  stable with no worrisome findings in 2 years, we will turn her care back over to her primary care physician.  Christie Regal, MD, [facility] Office: [phone]  Visit Diagnoses: 1. Multinodular goiter (nontoxic), dominant right nodule

## 2014-09-24 ENCOUNTER — Telehealth: Payer: Self-pay | Admitting: Oncology

## 2014-09-24 ENCOUNTER — Other Ambulatory Visit: Payer: BC Managed Care – PPO

## 2014-09-24 NOTE — Telephone Encounter (Signed)
, °

## 2014-09-25 ENCOUNTER — Other Ambulatory Visit (HOSPITAL_BASED_OUTPATIENT_CLINIC_OR_DEPARTMENT_OTHER): Payer: 59

## 2014-09-25 DIAGNOSIS — C50912 Malignant neoplasm of unspecified site of left female breast: Secondary | ICD-10-CM

## 2014-09-25 DIAGNOSIS — Z853 Personal history of malignant neoplasm of breast: Secondary | ICD-10-CM

## 2014-09-25 LAB — CBC WITH DIFFERENTIAL/PLATELET
BASO%: 0.6 % (ref 0.0–2.0)
Basophils Absolute: 0 10e3/uL (ref 0.0–0.1)
EOS%: 1.8 % (ref 0.0–7.0)
Eosinophils Absolute: 0.1 10e3/uL (ref 0.0–0.5)
HCT: 40 % (ref 34.8–46.6)
HGB: 13.1 g/dL (ref 11.6–15.9)
LYMPH%: 39.2 % (ref 14.0–49.7)
MCH: 27.9 pg (ref 25.1–34.0)
MCHC: 32.7 g/dL (ref 31.5–36.0)
MCV: 85.3 fL (ref 79.5–101.0)
MONO#: 0.5 10e3/uL (ref 0.1–0.9)
MONO%: 7.8 % (ref 0.0–14.0)
NEUT#: 3.2 10e3/uL (ref 1.5–6.5)
NEUT%: 50.6 % (ref 38.4–76.8)
Platelets: 187 10e3/uL (ref 145–400)
RBC: 4.69 10e6/uL (ref 3.70–5.45)
RDW: 13.2 % (ref 11.2–14.5)
WBC: 6.2 10e3/uL (ref 3.9–10.3)
lymph#: 2.4 10e3/uL (ref 0.9–3.3)

## 2014-09-25 LAB — COMPREHENSIVE METABOLIC PANEL (CC13)
ALT: 21 U/L (ref 0–55)
AST: 16 U/L (ref 5–34)
Albumin: 3.8 g/dL (ref 3.5–5.0)
Alkaline Phosphatase: 103 U/L (ref 40–150)
Anion Gap: 9 meq/L (ref 3–11)
BUN: 18 mg/dL (ref 7.0–26.0)
CO2: 26 meq/L (ref 22–29)
Calcium: 9.4 mg/dL (ref 8.4–10.4)
Chloride: 106 meq/L (ref 98–109)
Creatinine: 0.9 mg/dL (ref 0.6–1.1)
Glucose: 108 mg/dL (ref 70–140)
Potassium: 3.5 meq/L (ref 3.5–5.1)
Sodium: 141 meq/L (ref 136–145)
Total Bilirubin: 0.32 mg/dL (ref 0.20–1.20)
Total Protein: 6.8 g/dL (ref 6.4–8.3)

## 2014-10-01 ENCOUNTER — Ambulatory Visit (HOSPITAL_BASED_OUTPATIENT_CLINIC_OR_DEPARTMENT_OTHER): Payer: 59 | Admitting: Oncology

## 2014-10-01 VITALS — BP 114/65 | HR 81 | Temp 98.5°F | Resp 18 | Ht 61.0 in | Wt 159.6 lb

## 2014-10-01 DIAGNOSIS — Z1509 Genetic susceptibility to other malignant neoplasm: Principal | ICD-10-CM

## 2014-10-01 DIAGNOSIS — Z1501 Genetic susceptibility to malignant neoplasm of breast: Secondary | ICD-10-CM

## 2014-10-01 DIAGNOSIS — Z853 Personal history of malignant neoplasm of breast: Secondary | ICD-10-CM

## 2014-10-01 NOTE — Progress Notes (Signed)
ID: Christie Burton   DOB: 01-19-1971  MR#: [id number]  YHC#:[id number]  PCP: Donald Prose MD GYN: Delila Pereyra MD SU: Armandina Gemma, MD OTHER MD: Jari Pigg  CHIEF COMPLAINTS:  1)  Left Breast Cancer      2)   BRCA 1 positive   HISTORY OF PRESENT ILLNESS: From the original intake note:  Christie Burton was around 16 weeks of her 7th pregnancy when she noted a lump in her left breast. She brought this to the attention of Dr. Delcie Roch Dillard's office.  She was referred to the [facility], where baseline mammogram was performed November 1st.  This showed extremely dense fibrous tissue bilaterally without dominant masses.    Ultrasound of the breast on the left showed the palpable mass at 3 o'clock, 1 cm from the nipple, to be hypoechoic, with lobulated borders.  The left axilla showed no enlarged adenopathy.  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  There was no evidence of lymphovascular invasion and no in situ component. By ultrasound, the mass measured approximately 2.1 cm.    The patient's subsequent history is as detailed below  INTERVAL HISTORY: Christie Burton returns today for followup of her breast cancer. Interval history is generally unremarkable. 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 This is very demanding of her time and she has not been exercising regularly although she was able to run a little bit last week. She tells me that my, who is now 17 as a real interesting theater and is very good at it.  REVIEW OF SYSTEMS: Christie Burton had a pretty bad cough in the spring and to that she had some pain in both breasts. The pain in the right breast resolved, but the discomfort in her left breast has lingered. It isn't very intermittent. It is mild. It is more behind the breast at [facility] axillae than  anywhere else. It is not clearly related to position or activity. Overall it is not getting worse and it is not constant. Nevertheless it worries her. Aside from that a detailed review of systems today was entirely negative. Marland Kitchen   PAST MEDICAL HISTORY: Past Medical History  Diagnosis Date  . Cancer     BREAST  . Thyroid nodule     PAST SURGICAL HISTORY: Past Surgical History  Procedure Laterality Date  . Tram  2006    WITH RECONSTRUCTION  . Breast lumpectomy  2005  . Abdominal hysterectomy  2006    FAMILY HISTORY No family history on file. The patient's father lives in [place].  The patient's mother lives in [place]. Christie Burton has one brother and one sister. There is no history of breast or ovarian cancer in the immediate family.  GYNECOLOGIC HISTORY: (Updated 10/01/2013) The patient had 7 pregnancies of which she was able to carry 4 to term.  She had three miscarriages.  She status post hysterectomy and bilateral salpingo-oophorectomy in May 2006.  SOCIAL HISTORY: (updated 10/01/2013) The patient is a homemaker.  Her husband, Marguerite Olea, works as a Patent examiner.  He is originally from [place].  Christie Burton comes from a [employer] background and they attend the [employer].  Christie Burton has a Brewing technologist in school psychology.  Their children are two daughters aged 8 and 74 and two sons aged 62 and 24. They are being  home-schooled   ADVANCED DIRECTIVES:  HEALTH MAINTENANCE: (Updated 10/01/2013) History  Substance Use Topics  . Smoking status: Never Smoker   . Smokeless tobacco: Never Used  . Alcohol Use: Yes     Comment: 2 per week     Colonoscopy:  Never  PAP:  Due, Dr. Carren Rang  Bone density: Never  Lipid panel: Due, Dr Nancy Fetter    No Known Allergies  Current Outpatient Prescriptions  Medication Sig Dispense Refill  . diclofenac (VOLTAREN) 75 MG EC tablet Take 1 tablet (75 mg total) by mouth 2 (two) times daily.  50 tablet  2   No current facility-administered  medications for this visit.    OBJECTIVE: Middle-aged white woman who appears well  Filed Vitals:   10/01/14 1343  BP: 114/65  Pulse: 81  Temp: 98.5 F (36.9 C)  Resp: 18     Body mass index is 30.17 kg/(m^2).    ECOG FS: 0 Filed Weights   10/01/14 1343  Weight: 159 lb 9.6 oz (72.394 kg)   Sclerae unicteric, pupils equal and reactive Oropharynx clear, dentition in good repair No cervical or supraclavicular adenopathy Lungs no rales or rhonchi Heart regular rate and rhythm Abd soft, nontender, positive bowel sounds MSK no focal spinal tenderness, no upper extremity lymphedema Neuro: nonfocal, well oriented, appropriate affect Breasts: Both breasts are status post mastectomy with TRAM reconstruction. There is no evidence of local recurrence. On the left in particular there are no significant irregularities, erythema, swelling or tenderness to palpation. Both axillae are benign.   LAB RESULTS: Lab Results  Component Value Date   WBC 6.2 09/25/2014   NEUTROABS 3.2 09/25/2014   HGB 13.1 09/25/2014   HCT 40.0 09/25/2014   MCV 85.3 09/25/2014   PLT 187 09/25/2014      Chemistry      Component Value Date/Time   NA 141 09/25/2014 1520   NA 137 06/06/2012 1255   K 3.5 09/25/2014 1520   K 4.0 06/06/2012 1255   CL 101 09/24/2012 1407   CL 100 06/06/2012 1255   CO2 26 09/25/2014 1520   CO2 28 06/06/2012 1255   BUN 18.0 09/25/2014 1520   BUN 15 06/06/2012 1255   CREATININE 0.9 09/25/2014 1520   CREATININE 0.76 06/06/2012 1255      Component Value Date/Time   CALCIUM 9.4 09/25/2014 1520   CALCIUM 9.5 06/06/2012 1255   ALKPHOS 103 09/25/2014 1520   ALKPHOS 94 06/06/2012 1255   AST 16 09/25/2014 1520   AST 22 06/06/2012 1255   ALT 21 09/25/2014 1520   ALT 17 06/06/2012 1255   BILITOT 0.32 09/25/2014 1520   BILITOT 0.8 06/06/2012 1255      STUDIES: No results found.  ASSESSMENT: 43 y.o. 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    (3)  All chemotherapy completed August 2006 at which time she had bilateral mastectomies with TRAM reconstruction, showing no residual tumor.    (4)  She has also undergone bilateral salpingo-oophorectomy and hysterectomy as of May 2006.    PLAN:  Christie Burton is 10 years out from her definitive surgery. There is no evidence of disease recurrence. I am comfortable releasing her from followup at this point.  Discomfort she is  experiencing in the left breast area is very intermittent and if anything it is getting better. If it were breast cancer most likely it wouldn't hurt all but if you did hurt he would her continuously and it would be constantly getting worse. Accordingly I am comfortable with no further evaluation beyond followup. If it becomes worse so she will let me know and we will set her up for an MRI of the breast.   All she will need as far as breast cancer screening is a yearly physician breast exam. Any local recurrence is likely to be right on the surface of the reconstructed breasts. I will be glad to see Christie Burton at any point in the future if the need arises, but as of now we are making no further routine appointments for her here.   Chauncey Cruel, MD    10/01/2014

## 2014-10-02 ENCOUNTER — Telehealth: Payer: Self-pay | Admitting: Oncology

## 2014-10-02 NOTE — Telephone Encounter (Signed)
Faxed pt office note with Dr. Jana Hakim letter

## 2014-10-22 ENCOUNTER — Ambulatory Visit (INDEPENDENT_AMBULATORY_CARE_PROVIDER_SITE_OTHER): Payer: 59

## 2014-10-22 ENCOUNTER — Encounter: Payer: Self-pay | Admitting: Podiatry

## 2014-10-22 ENCOUNTER — Ambulatory Visit (INDEPENDENT_AMBULATORY_CARE_PROVIDER_SITE_OTHER): Payer: 59 | Admitting: Podiatry

## 2014-10-22 VITALS — BP 112/75 | HR 74 | Resp 16

## 2014-10-22 DIAGNOSIS — M779 Enthesopathy, unspecified: Secondary | ICD-10-CM

## 2014-10-22 MED ORDER — TRIAMCINOLONE ACETONIDE 10 MG/ML IJ SUSP
10.0000 mg | Freq: Once | INTRAMUSCULAR | Status: AC
  Administered 2014-10-22: 10 mg

## 2014-10-22 NOTE — Progress Notes (Signed)
Subjective:     Patient ID: Christie Burton, female   DOB: 11-Mar-1971, 43 y.o.   MRN: [id number]  HPIpatient presents stating I'm still having pain in my right second metatarsal and it did improve for several months but this last week and I do a lot of walking in [place] and it got very painful   Review of Systems     Objective:   Physical Exam Neurovascular status intact with no other changes noted and found to have discomfort in the distal second metatarsal shaft not in the capsule itself with inflammation noted and stating that it's been this way for a long time and is just gotten somewhat worse recently    Assessment:     Probable tendinitis cannot rule out stress fracture or other inflammatory condition    Plan:     Reviewed x-ray and today did a careful sheath injection right with 3 mg Kenalog 5 mg Xylocaine and I then went ahead and I scanned her for orthotics to reduce pressure against her feet. Patient will be seen back for [place] to recheck again in the next month and I advised on ice therapy

## 2015-01-12 ENCOUNTER — Ambulatory Visit: Payer: 59 | Admitting: *Deleted

## 2015-01-12 DIAGNOSIS — M779 Enthesopathy, unspecified: Secondary | ICD-10-CM

## 2015-01-12 NOTE — Progress Notes (Signed)
PICKING UP MY ORTHOTICS

## 2015-01-12 NOTE — Patient Instructions (Signed)

## 2015-01-13 ENCOUNTER — Other Ambulatory Visit: Payer: Self-pay | Admitting: Dermatology

## 2015-08-28 IMAGING — US US SOFT TISSUE HEAD/NECK
1 series · 14 of 25 positions shown · non-contrast
Comparison: Ultrasound of the thyroid of 02/17/2013, history of
biopsy of the dominant right thyroid lesion on 01/10/2011

CLINICAL DATA: Followup thyroid nodules

EXAM:
THYROID ULTRASOUND
TECHNIQUE: Ultrasound examination of the thyroid gland and adjacent soft
tissues was performed.

[Series 1: us soft tissue head/neck · 0.06mm/px · 14 of 41 slices shown]
[im 1/41]
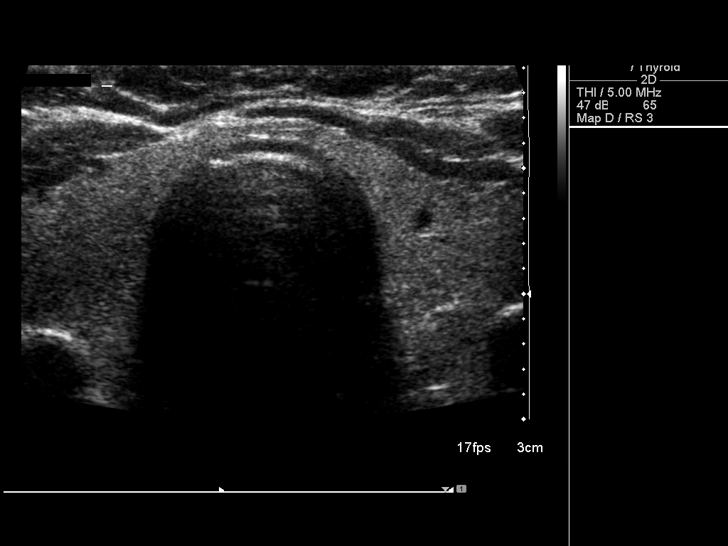
[im 4/41]
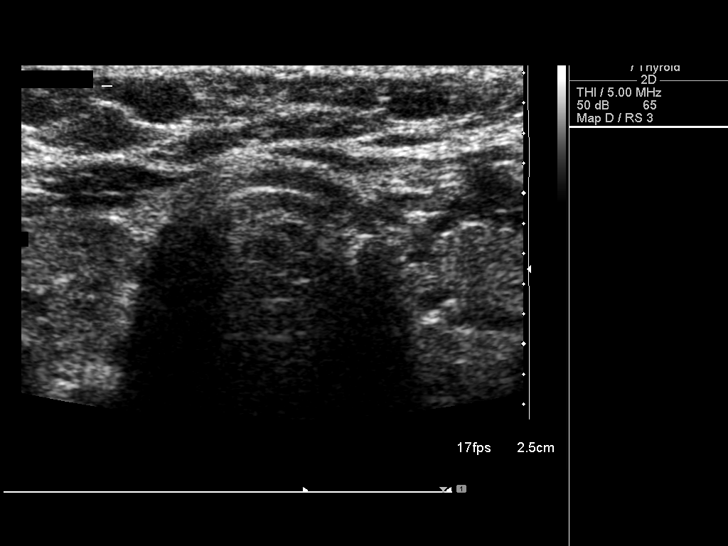
[im 7/41]
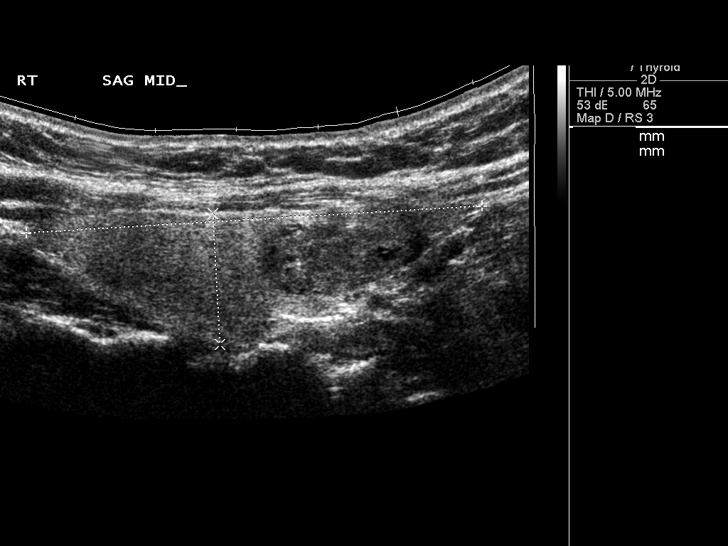
[im 11/41]
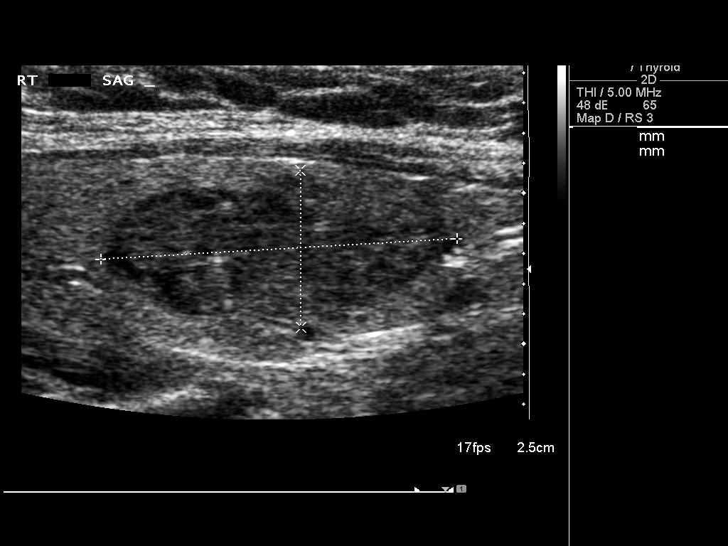
[im 14/41]
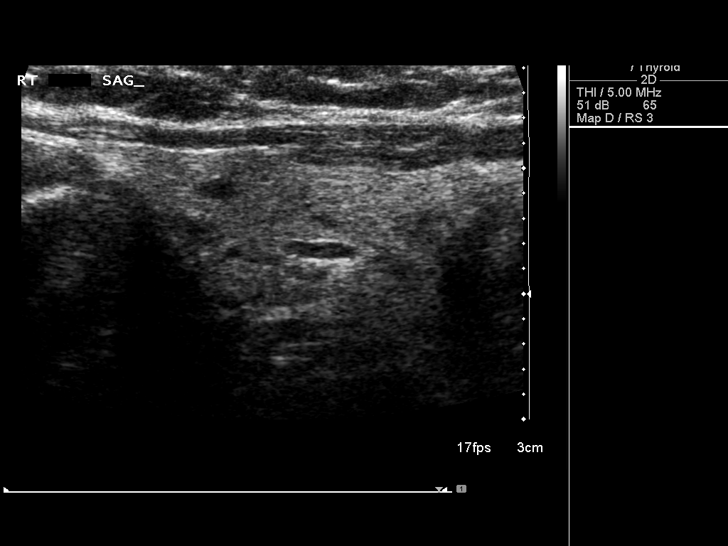
[im 16/41]
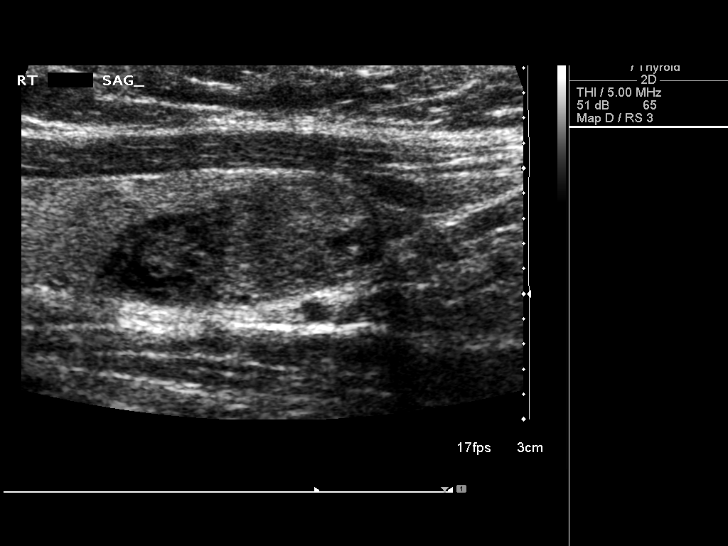
[im 19/41]
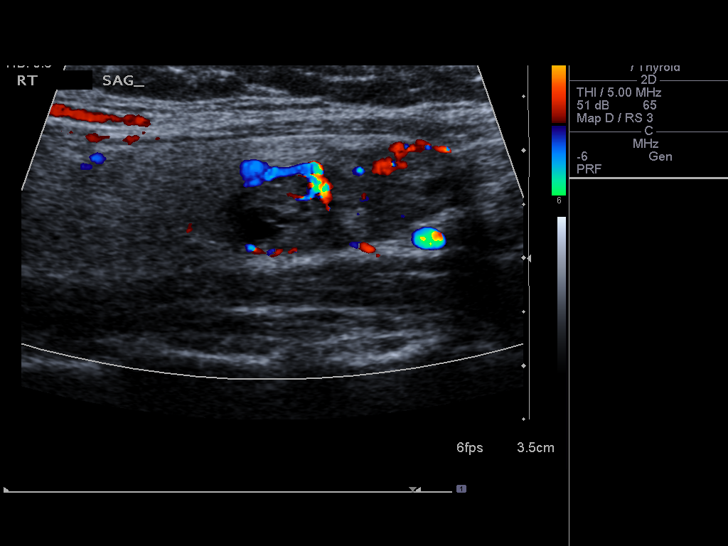
[im 22/41]
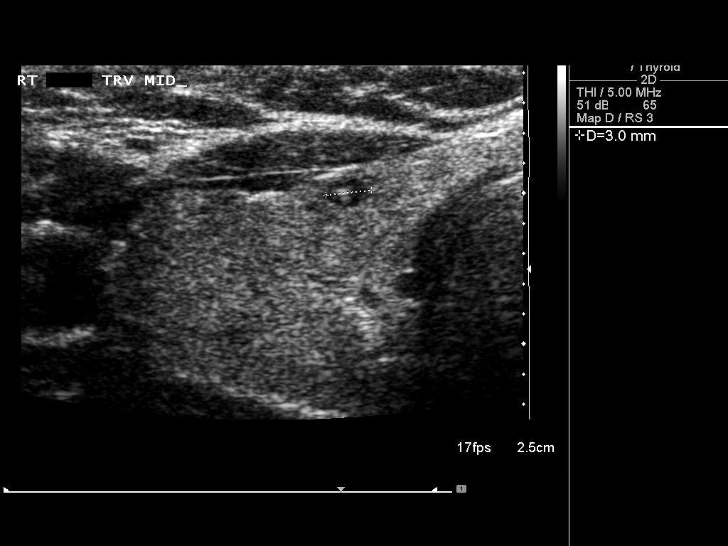
[im 26/41]
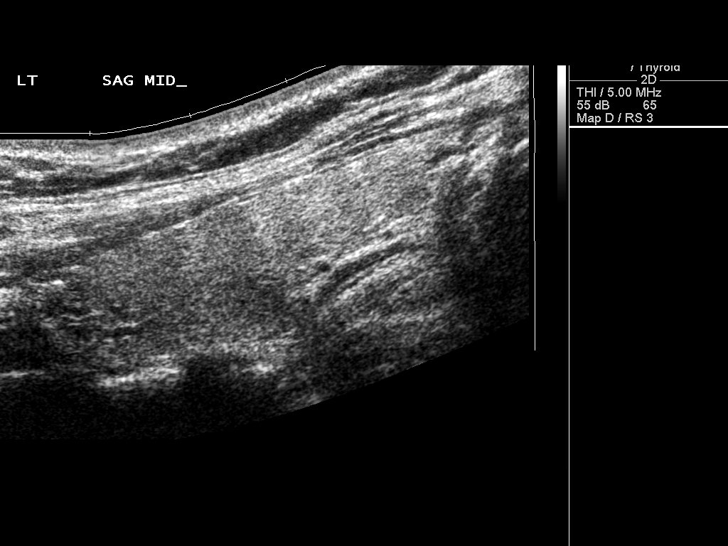
[im 27/41]
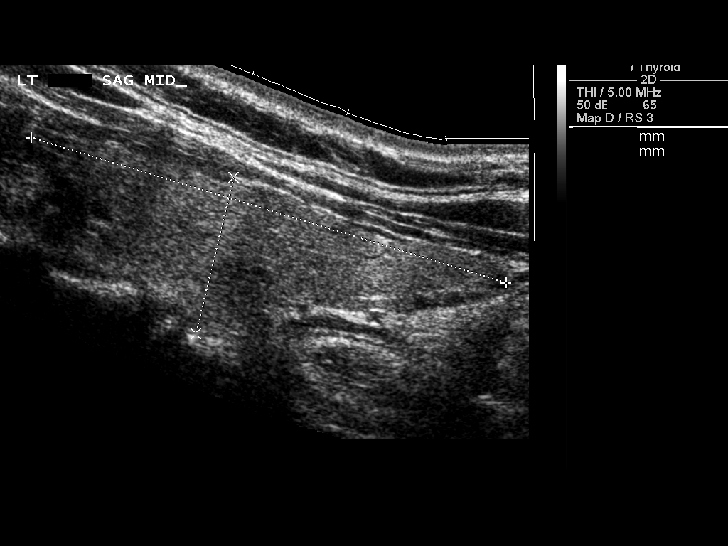
[im 31/41]
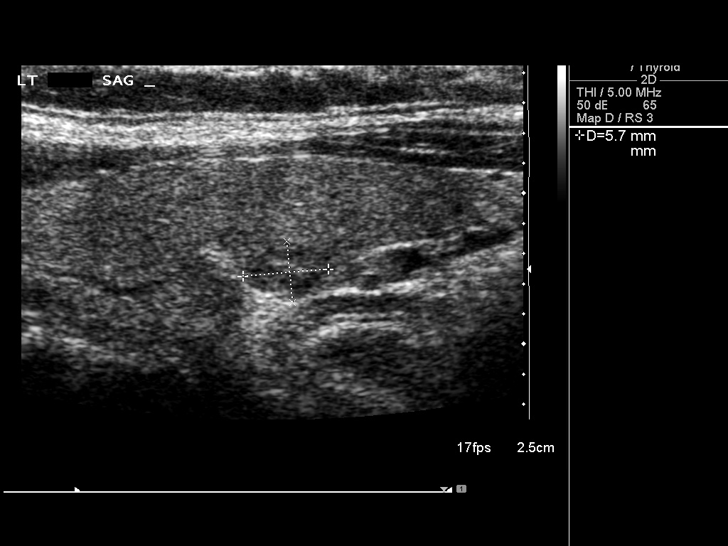
[im 34/41]
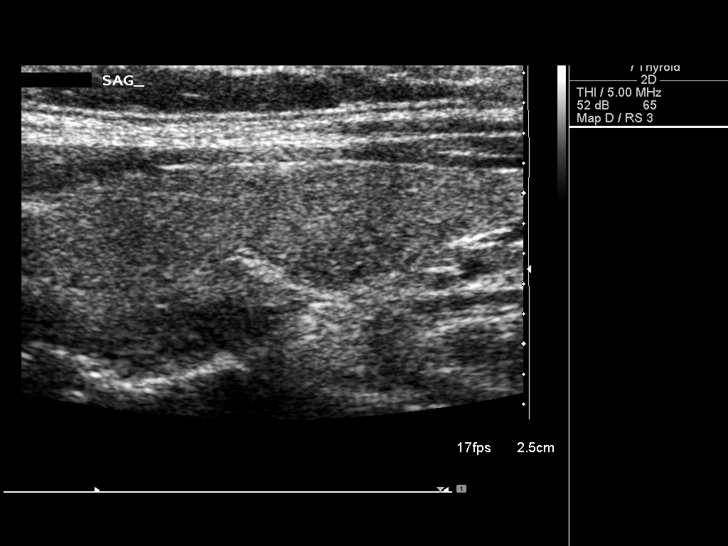
[im 37/41]
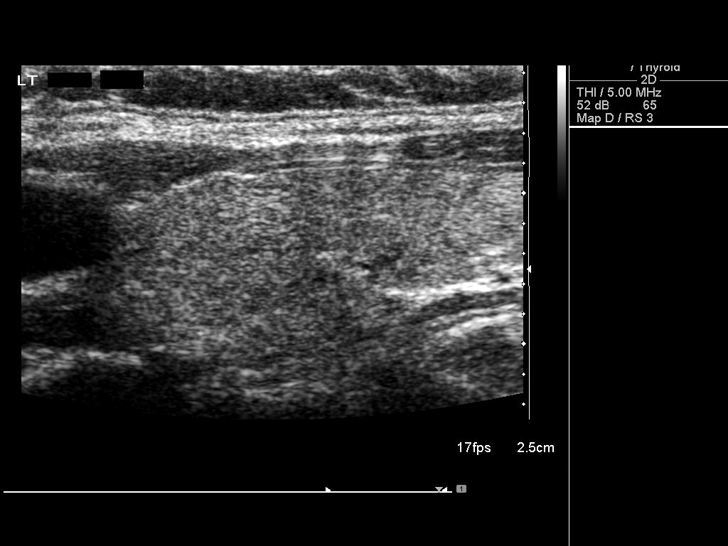
[im 41/41]
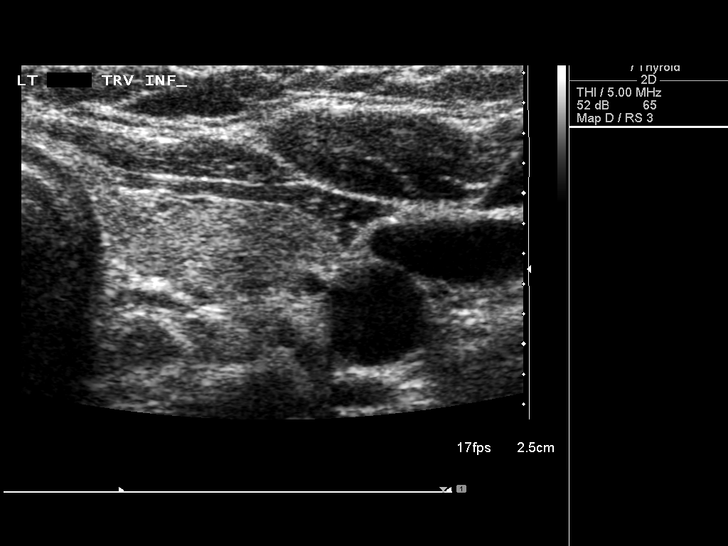

[14 of 25 positions shown; findings below may reference images not displayed]

FINDINGS: Right thyroid lobe

Measurements: 5.6 x 1.6 x 2.0 cm. (Previously 5.9 x 1.4 x 1.8 cm)..
The dominant primarily solid nodule in the lower pole of the right
lobe measures 2.4 x 1.0 x 1.6 cm compared to prior measurement of
2.4 x 1.3 x 1.6 cm. Small nodules medially on the right are no
larger than 3 mm in diameter.

Left thyroid lobe

Measurements: 4.8 x 1.6 x 1.7 cm. (Previously 4.8 x 1.2 x 1.6 cm.. A
solid nodule in the lower pole of the left lobe measures 6 x 4 x 4
mm compared to 5 mm previously

Isthmus

Thickness: 2 mm in thickness..  No nodules visualized.

Lymphadenopathy

None visualized.
IMPRESSION: Stable thyroid nodules with the dominant solid nodule on the right
having previously been biopsied.

## 2016-06-02 ENCOUNTER — Encounter: Payer: Self-pay | Admitting: Genetic Counselor

## 2017-09-14 ENCOUNTER — Ambulatory Visit (INDEPENDENT_AMBULATORY_CARE_PROVIDER_SITE_OTHER): Payer: BLUE CROSS/BLUE SHIELD | Admitting: Certified Nurse Midwife

## 2017-09-14 ENCOUNTER — Encounter: Payer: Self-pay | Admitting: Certified Nurse Midwife

## 2017-09-14 ENCOUNTER — Other Ambulatory Visit (HOSPITAL_COMMUNITY)
Admission: RE | Admit: 2017-09-14 | Discharge: 2017-09-14 | Disposition: A | Discharge status: Home / Self Care | Payer: BLUE CROSS/BLUE SHIELD | Source: Ambulatory Visit | Attending: Certified Nurse Midwife | Admitting: Certified Nurse Midwife

## 2017-09-14 VITALS — BP 120/74 | HR 68 | Resp 16 | Ht 60.25 in | Wt 132.0 lb

## 2017-09-14 DIAGNOSIS — Z124 Encounter for screening for malignant neoplasm of cervix: Secondary | ICD-10-CM

## 2017-09-14 DIAGNOSIS — N762 Acute vulvitis: Secondary | ICD-10-CM | POA: Diagnosis not present

## 2017-09-14 DIAGNOSIS — Z1151 Encounter for screening for human papillomavirus (HPV): Secondary | ICD-10-CM | POA: Diagnosis not present

## 2017-09-14 DIAGNOSIS — B372 Candidiasis of skin and nail: Secondary | ICD-10-CM | POA: Diagnosis not present

## 2017-09-14 DIAGNOSIS — E041 Nontoxic single thyroid nodule: Secondary | ICD-10-CM

## 2017-09-14 DIAGNOSIS — Z0000 Encounter for general adult medical examination without abnormal findings: Secondary | ICD-10-CM

## 2017-09-14 DIAGNOSIS — Z853 Personal history of malignant neoplasm of breast: Secondary | ICD-10-CM

## 2017-09-14 DIAGNOSIS — Z01419 Encounter for gynecological examination (general) (routine) without abnormal findings: Secondary | ICD-10-CM | POA: Diagnosis not present

## 2017-09-14 MED ORDER — NYSTATIN 100000 UNIT/GM EX CREA: 1.0000 "application " | TOPICAL_CREAM | Freq: Two times a day (BID) | CUTANEOUS | 0 refills | Status: AC

## 2017-09-14 NOTE — Progress Notes (Signed)
46 y.o. [id number] Married  Caucasian Fe here to establish gyn care and  for annual exam. Last aex was with follow up with hysterectomy, and breast cancer with oncology. Patient has the [facility] 1, her sister and brother also have the gene. Patient was diagnosed with thyroid nodule 13 years ago and was followed by  Dr.Gerkin. Was initially seen by Dr. Chalmers Cater. Has noted some hair loss and skin change ? Thyroid. Needs referral in because she has not been seen in Dr. Harlow Asa office in the past 3 years. Also complaining of rectal itching ? Hemorrhoids. Denies vaginal or rectal bleeding. No constipation or hard or blood in stools. Eats well and exercises daily. No other health issues today. Would like screening labs.   No LMP recorded. Patient has had a hysterectomy. supracervical.  2006    ( 11/05 with breast cancer) Sexually active: Yes.    The current method of family planning is status post hysterectomy.    Exercising: Yes.    ymca, running Smoker:  no  Health Maintenance: Pap:  4-53yrs ago neg per patient, per patient still has cervix History of Abnormal Pap: no MMG:  A couple of yrs ago Self Breast exams: yes Colonoscopy:  none BMD:   none TDaP:  Unsure will request records from Dr. Nancy Fetter Shingles: no Pneumonia: no Hep C and HIV: HIV Labs: if needed   reports that she has never smoked. She has never used smokeless tobacco. She reports that she drinks alcohol. She reports that she does not use drugs.  Past Medical History:  Diagnosis Date  . Cancer (HCC)    BREAST  . Infertility, female   . Migraines    in the past  . Thyroid nodule     Past Surgical History:  Procedure Laterality Date  . ABDOMINAL HYSTERECTOMY  2006  . BREAST LUMPECTOMY  2005  . MASTECTOMY     bilateral  . TRAM  2006   WITH RECONSTRUCTION    Current Outpatient Prescriptions  Medication Sig Dispense Refill  . MAGNESIUM PO Take by mouth daily.    Marland Kitchen POTASSIUM PO Take by mouth daily.    . Prenatal Vit-Fe Fumarate-FA  (PRENATAL VITAMIN PO) Take by mouth daily.     No current facility-administered medications for this visit.     Family History  Problem Relation Age of Onset  . Breast cancer Sister     ROS:  Pertinent items are noted in HPI.  Otherwise, a comprehensive ROS was negative.  Exam:   BP 120/74   Pulse 68   Resp 16   Ht 5' 0.25" (1.53 m)   Wt 132 lb (59.9 kg)   BMI 25.57 kg/m  Height: 5' 0.25" (153 cm) Ht Readings from Last 3 Encounters:  09/14/17 5' 0.25" (1.53 m)  10/01/14 5\' 1"  (1.549 m)  05/18/14 5\' 1"  (1.549 m)    General appearance: alert, cooperative and appears stated age Head: Normocephalic, without obvious abnormality, atraumatic Neck: no adenopathy, supple, symmetrical, trachea midline and thyroid nodules noted on left, small Lungs: clear to auscultation bilaterally Breasts: Inspection negative, has bilateral transflap with reconstruction with abdominal fat, no breast tissue, mammograms recommened per Dr. Griffith Citron, no unusual areas noted in scarring areas Heart: regular rate and rhythm Abdomen: soft, non-tender; no masses,  no organomegaly Extremities: extremities normal, atraumatic, no cyanosis or edema Skin: Skin color, texture, turgor normal. No rashes or lesions Lymph nodes: Cervical, supraclavicular, and axillary nodes normal. No abnormal inguinal nodes palpated Neurologic: Grossly normal  Pelvic: External genitalia:  no lesions              Urethra:  normal appearing urethra with no masses, tenderness or lesions              Bartholin's and Skene's: normal                 Vagina: normal appearing vagina with normal color and discharge, no lesions, perineal area slight increase pink with scaling noted, wet prep taken              Cervix: multiparous appearance, no cervical motion tenderness and no lesions              Pap taken: Yes.   Bimanual Exam:  Uterus:  uterus absent              Adnexa: no mass, fullness, tenderness and adnexa surgically absent                Rectovaginal: Confirms               Anus:  normal sphincter tone,hemorrhoid tag noted at entrance of anal opening with scaling and increase pink appearance at skin around area. Wet prep taken  Chaperone present: yes  A:  Well Woman with normal exam  History of breast cancer with BRACA 1 gene bilateral mastectomy 2005 with transflap reconstruction out of oncology follow up now  Supracervical TAH with BSO for prevention of further cancer  Yeast dermatitis rectal area/vulvitis  History of thyroid nodules was follow by surgeon, no follow up in 3 years  Family history of breast cancer sister age 35 BRACA 1 positive, brother BRACA 1 positive  Screening labs  P:   Reviewed health and wellness pertinent to exam  Continue to inspect area around transflap bilateral and axilla area and report if any changes  Discussed pap smear per guideline follow up  Discussed yeast dermatitis/vulvitis finding and etiology. Discussed changing out wet clothes after workouts will help.  Rx Nystatin cream see order with instructions, also mix small amount of 1 % hydrocortisone OTC with cream to help with itching.  Discussed drawing TSH and having imaging rather than referral to surgeon. Patient will consider after labs back.     Lab: TSH  Stressed importance of having her daughters checked for [employer] 1,2, patient plans to do this per oncology recommendation  Labs:Lipid panel, CMP,CBC, Vitamin D  Pap smear: yes  counseled on breast self exam, feminine hygiene, adequate intake of calcium and vitamin D, diet and exercise, Kegel's exercises return annually or prn  An After Visit Summary was printed and given to the patient.

## 2017-09-14 NOTE — Patient Instructions (Signed)

## 2017-09-15 LAB — LIPID PANEL
Chol/HDL Ratio: 2.1 ratio (ref 0.0–4.4)
Cholesterol, Total: 236 mg/dL — ABNORMAL HIGH (ref 100–199)
HDL: 113 mg/dL (ref >39)
LDL Calculated: 111 mg/dL — ABNORMAL HIGH (ref 0–99)
Triglycerides: 61 mg/dL (ref 0–149)
VLDL Cholesterol Cal: 12 mg/dL (ref 5–40)

## 2017-09-15 LAB — CBC
Hematocrit: 41.2 % (ref 34.0–46.6)
Hemoglobin: 13.6 g/dL (ref 11.1–15.9)
MCH: 30 pg (ref 26.6–33.0)
MCHC: 33 g/dL (ref 31.5–35.7)
MCV: 91 fL (ref 79–97)
Platelets: 208 10*3/uL (ref 150–379)
RBC: 4.54 x10E6/uL (ref 3.77–5.28)
RDW: 13.9 % (ref 12.3–15.4)
WBC: 5.2 10*3/uL (ref 3.4–10.8)

## 2017-09-15 LAB — COMPREHENSIVE METABOLIC PANEL
ALT: 28 IU/L (ref 0–32)
AST: 28 IU/L (ref 0–40)
Albumin/Globulin Ratio: 2 (ref 1.2–2.2)
Albumin: 4.8 g/dL (ref 3.5–5.5)
Alkaline Phosphatase: 104 IU/L (ref 39–117)
BUN/Creatinine Ratio: 15 (ref 9–23)
BUN: 11 mg/dL (ref 6–24)
Bilirubin Total: 0.5 mg/dL (ref 0.0–1.2)
CO2: 25 mmol/L (ref 20–29)
Calcium: 9.6 mg/dL (ref 8.7–10.2)
Chloride: 99 mmol/L (ref 96–106)
Creatinine, Ser: 0.71 mg/dL (ref 0.57–1.00)
GFR calc Af Amer: 117 mL/min/{1.73_m2} (ref >59)
GFR calc non Af Amer: 102 mL/min/{1.73_m2} (ref >59)
Globulin, Total: 2.4 g/dL (ref 1.5–4.5)
Glucose: 83 mg/dL (ref 65–99)
Potassium: 4.1 mmol/L (ref 3.5–5.2)
Sodium: 139 mmol/L (ref 134–144)
Total Protein: 7.2 g/dL (ref 6.0–8.5)

## 2017-09-15 LAB — VITAMIN D 25 HYDROXY (VIT D DEFICIENCY, FRACTURES): Vit D, 25-Hydroxy: 31 ng/mL (ref 30.0–100.0)

## 2017-09-15 LAB — TSH: TSH: 1.37 u[IU]/mL (ref 0.450–4.500)

## 2017-09-18 LAB — CYTOLOGY - PAP
Diagnosis: NEGATIVE
HPV: NOT DETECTED

## 2017-10-01 ENCOUNTER — Telehealth: Payer: Self-pay | Admitting: *Deleted

## 2017-10-01 DIAGNOSIS — E041 Nontoxic single thyroid nodule: Secondary | ICD-10-CM

## 2017-10-01 NOTE — Telephone Encounter (Signed)
Spoke with patient, advised per Melvia Heaps, CNM - see [facility] OV dated 01/03/17 -sent to scan.   Patient agreeable to scheduling and completing thyroid US first. Referral to [facility], Dr. Harlow Asa for evaluation of lipoma on arm and thyroid nodule will be placed after thyroid US received. Patient agreeable to plan.  Advised patient [facility] will contact directly for scheduling thyroid US. Patient verbalizes understanding and is agreeable.   Routing to provider for final review. Patient is agreeable to disposition. Will close encounter.   Cc: Lerry Liner

## 2017-11-07 ENCOUNTER — Encounter: Payer: Self-pay | Admitting: Podiatry

## 2017-11-07 ENCOUNTER — Ambulatory Visit (INDEPENDENT_AMBULATORY_CARE_PROVIDER_SITE_OTHER): Payer: BLUE CROSS/BLUE SHIELD

## 2017-11-07 ENCOUNTER — Ambulatory Visit (INDEPENDENT_AMBULATORY_CARE_PROVIDER_SITE_OTHER): Payer: BLUE CROSS/BLUE SHIELD | Admitting: Podiatry

## 2017-11-07 DIAGNOSIS — M722 Plantar fascial fibromatosis: Secondary | ICD-10-CM | POA: Diagnosis not present

## 2017-11-07 MED ORDER — DICLOFENAC SODIUM 75 MG PO TBEC: 75.0000 mg | DELAYED_RELEASE_TABLET | Freq: Two times a day (BID) | ORAL | 2 refills | Status: DC

## 2017-11-07 MED ORDER — TRIAMCINOLONE ACETONIDE 10 MG/ML IJ SUSP
10.0000 mg | Freq: Once | INTRAMUSCULAR | Status: AC
  Administered 2017-11-07: 10 mg

## 2017-11-07 NOTE — Progress Notes (Signed)
Subjective:    Patient ID: Christie Burton, female   DOB: 46 y.o.   MRN: [id number]   HPI patient states that she's had a lot of pain in her right heel and that she just finished running a half a marathon and the pain intensified since then. Patient does not smoke and likes to be very active    Review of Systems  All other systems reviewed and are negative.       Objective:  Physical Exam  Constitutional: She appears well-developed and well-nourished.  Cardiovascular: Intact distal pulses.  Pulmonary/Chest: Effort normal.  Musculoskeletal: Normal range of motion.  Neurological: She is alert.  Skin: Skin is warm.  Nursing note and vitals reviewed.  neurovascular status intact muscle strength adequate range of motion within normal limits with exquisite discomfort plantar aspect right heel at the insertional point of the tendon the calcaneus with inflammation fluid around the medial band. Patient's found to have diminishment of the arch and has good digital perfusion     Assessment:   Plantar fasciitis with inflammation of the medial band acute in nature      Plan:    H&P condition reviewed and today careful injection administered 3 mg Kenalog 5 g Xylocaine into the fascial band insertion and applied fascial brace. I then discussed orthotics and she's had them in the past and will bring them in for evaluation and may require  Exam was given instructions on physical therapy  X-rays indicate that there is small spur with no indication of stress fracture arthritis

## 2017-11-07 NOTE — Patient Instructions (Signed)

## 2017-11-08 ENCOUNTER — Telehealth: Payer: Self-pay | Admitting: Podiatry

## 2017-11-08 NOTE — Telephone Encounter (Signed)
I saw Dr. Paulla Dolly yesterday and he gave me a brace to wear for my plantar fasciitis. I cannot get it positioned where it does not cut into the top of my foot. After a while it is uncomfortable. I didn't know if there is something else I should do or try. I cannot wear it because it hurts. I don't want it billed for insurance because I may have to pay for it myself if I cannot use it. I didn't know if I should bring it back or what to do. Please call me back at [phone].

## 2017-11-08 NOTE — Telephone Encounter (Signed)
Pt states she keeps pulling the brace up because it keeps rubbing on the top of her foot. I told pt is could help her position the brace if she would come in tomorrow before noon. Pt agreed.

## 2017-11-14 ENCOUNTER — Other Ambulatory Visit: Payer: BLUE CROSS/BLUE SHIELD

## 2017-11-20 ENCOUNTER — Other Ambulatory Visit: Payer: BLUE CROSS/BLUE SHIELD

## 2017-11-21 ENCOUNTER — Ambulatory Visit: Payer: BLUE CROSS/BLUE SHIELD | Admitting: Podiatry

## 2017-11-23 ENCOUNTER — Ambulatory Visit
Admission: RE | Admit: 2017-11-23 | Discharge: 2017-11-23 | Disposition: A | Discharge status: Home / Self Care | Payer: BLUE CROSS/BLUE SHIELD | Source: Ambulatory Visit | Attending: Certified Nurse Midwife | Admitting: Certified Nurse Midwife

## 2017-11-23 DIAGNOSIS — E041 Nontoxic single thyroid nodule: Secondary | ICD-10-CM

## 2017-11-26 ENCOUNTER — Telehealth: Payer: Self-pay

## 2017-11-26 NOTE — Telephone Encounter (Signed)
Spoke with patient. Results given. Patient verbalizes understanding. Does not want referral to endocrine at this time.  Encounter closed.

## 2017-11-26 NOTE — Telephone Encounter (Signed)
-----   Message from Regina Eck, CNM sent at 11/26/2017  6:34 AM EST ----- Notify patient that has not been any significant change in thyroid right nodule that was biopsied. A nodule in left thyroid lobe no significant change and no new thyroid nodules. Will follow clinically with exam at this point, unless patient desires referral to endocrine.

## 2017-11-26 NOTE — Telephone Encounter (Signed)
Patient returning Kaitlyn's call. °

## 2017-11-26 NOTE — Telephone Encounter (Signed)
Left message to call Kaitlyn at [phone]. 

## 2018-01-07 ENCOUNTER — Encounter: Payer: Self-pay | Admitting: Podiatry

## 2018-01-07 ENCOUNTER — Ambulatory Visit: Payer: BLUE CROSS/BLUE SHIELD | Admitting: Podiatry

## 2018-01-07 DIAGNOSIS — M722 Plantar fascial fibromatosis: Secondary | ICD-10-CM | POA: Diagnosis not present

## 2018-01-07 MED ORDER — TRIAMCINOLONE ACETONIDE 10 MG/ML IJ SUSP
10.0000 mg | Freq: Once | INTRAMUSCULAR | Status: AC
  Administered 2018-01-07: 10 mg

## 2018-01-09 NOTE — Progress Notes (Signed)
Subjective:   Patient ID: Christie Burton, female   DOB: 47 y.o.   MRN: [id number]   HPI Patient presents stating I am still getting a lot of pain in my right heel and it only lasted for about a week as far as relief and is worse after than active or after I try to be on my foot for a while.  Also states it hurts when getting up in the morning   ROS      Objective:  Physical Exam  Neurovascular status intact with patient found to have quite a bit of discomfort in the plantar heel right that is very sore when pressed with continued inflammation into the center and distal portion of the fascia     Assessment:  Plantar fasciitis right with inflammation fluid buildup around the medial band     Plan:  H&P condition reviewed and I recommended long-term orthotics and scan for customized orthotic devices.  I then went ahead and dispensed night splint with instructions on usage and I like for her to sleep with it in the next week and then gradually use it in the evenings.  I reinjected the plantar fascia 3 mg Kenalog 5 mg Xylocaine and she will see ped orthotist when orthotics are ready and possibly me depending on response

## 2018-01-24 ENCOUNTER — Other Ambulatory Visit: Payer: BLUE CROSS/BLUE SHIELD | Admitting: Orthotics

## 2018-02-04 ENCOUNTER — Other Ambulatory Visit: Payer: BLUE CROSS/BLUE SHIELD | Admitting: Orthotics

## 2018-05-08 ENCOUNTER — Telehealth: Payer: Self-pay | Admitting: Oncology

## 2018-05-08 NOTE — Telephone Encounter (Signed)
Faxed medical records to [facility] c/o [place] @ [phone]. Release XB#[id number]

## 2018-07-12 ENCOUNTER — Telehealth: Payer: Self-pay | Admitting: Certified Nurse Midwife

## 2018-07-12 NOTE — Telephone Encounter (Signed)
Return call to patient. She is out of town currently on vacation. Two weeks ago, she was wakeboarding and felt a "pop" in her chest while attempting to wakeboard. Has been having pain in between her breasts x last two weeks. S/p tram flap reconstruction of breast 13 years ago.  Taking Motrin with some relief in symptoms. No sob or chest pain.  Has pain with coughing and Felisia Balcom movement of arms. Advised patient to seek care while out of town at an urgent care, may need chest xray. Pt agrees to seek care as soon as possible and breast check scheduled with Dr. Quincy Simmonds on Monday 07/15/18.

## 2018-07-12 NOTE — Telephone Encounter (Signed)
Patient had reconstructive surgery for breast cancer and is having pain in her chest area like she may have injured the area.

## 2018-07-15 ENCOUNTER — Other Ambulatory Visit: Payer: Self-pay

## 2018-07-15 ENCOUNTER — Telehealth: Payer: Self-pay | Admitting: Obstetrics and Gynecology

## 2018-07-15 ENCOUNTER — Ambulatory Visit: Payer: Self-pay | Admitting: Obstetrics and Gynecology

## 2018-07-15 ENCOUNTER — Ambulatory Visit: Payer: BLUE CROSS/BLUE SHIELD | Admitting: Obstetrics & Gynecology

## 2018-07-15 ENCOUNTER — Encounter: Payer: Self-pay | Admitting: Obstetrics & Gynecology

## 2018-07-15 VITALS — BP 130/88 | HR 68 | Resp 16 | Ht 60.25 in | Wt 149.8 lb

## 2018-07-15 DIAGNOSIS — R0789 Other chest pain: Secondary | ICD-10-CM | POA: Diagnosis not present

## 2018-07-15 MED ORDER — HYDROCODONE-ACETAMINOPHEN 5-325 MG PO TABS: 1.0000 | ORAL_TABLET | Freq: Four times a day (QID) | ORAL | 0 refills | Status: AC | PRN

## 2018-07-15 MED ORDER — CELECOXIB 200 MG PO CAPS: 200.0000 mg | ORAL_CAPSULE | Freq: Two times a day (BID) | ORAL | 0 refills | Status: DC

## 2018-07-15 NOTE — Telephone Encounter (Signed)
Called patient to reschedule her problem visit appointment from today with Dr Quincy Simmonds and she would like a nurse to call and get her rescheduled with another provider if possible.

## 2018-07-15 NOTE — Telephone Encounter (Signed)
Patient scheduled for breast check today.  Did go to urgent care over the weekend, normal exam per patient.  Office visit tomorrow with Melvia Heaps CNM scheduled.  Advised would return call with earlier availability if possible. Pt agreed.

## 2018-07-15 NOTE — Progress Notes (Signed)
GYNECOLOGY  VISIT  CC:   Breast pain  HPI: 47 y.o. [id number] Married Caucasian female here for breast pain/chest wall pain.  Pt reports this started after begin on vacation to [facility] in [place] a few weeks ago.  She was wakeboarding and felt a "pop" in her check.  She has immediate pain afterwards that was on both sides of her sternum but a little more on the left.  It's been about 2 1/2 weeks and she has taking some ibuprofen without relief.  Has not modified activity at all.  Feels she knows the cause of the pain but was concerned as it was not any better.   She called late on Friday and was advised to be seen at urgent care and she went on Saturday to the [facility] on [address].  She had a chest xay that was negative per her report for fractures.    She does have a hx of BRCA 1 + testing after being diagnosed with breast cancer in 2005.  Treatment included bilateral mastectomies.  She then had bilateral tram flap reconstruction.  Does not get mammograms due to have no or minimal breast tissue.  Even though she is pretty sure of the source of the pain, having had breast cancer always makes her a little nervous.    GYNECOLOGIC HISTORY: No LMP recorded. Patient has had a hysterectomy. Contraception: hysterectomy  Menopausal hormone therapy: none  Patient Active Problem List   Diagnosis Date Noted  . BRCA1 positive 10/01/2013  . Migraines 10/01/2013  . 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 Breast cancer, left breast ([place]) 06/05/2012  . Multinodular goiter (nontoxic), dominant right nodule 02/19/2012    Past Medical History:  Diagnosis Date  . Cancer (HCC)    BREAST  . Infertility, female   . Migraines    in the past  . Thyroid nodule     Past Surgical History:  Procedure Laterality Date  . ABDOMINAL HYSTERECTOMY  2006   still has cervix  . BREAST LUMPECTOMY  2005  . MASTECTOMY     bilateral  . TRAM  2006   WITH RECONSTRUCTION    MEDS:   Current Outpatient Medications on  File Prior to Visit  Medication Sig Dispense Refill  . fluocinonide (LIDEX) 0.05 % external solution daily as needed.  0  . nystatin cream (MYCOSTATIN) Apply 1 application topically 2 (two) times daily. Apply to affected area BID for up to 5 days 30 g 0   No current facility-administered medications on file prior to visit.     ALLERGIES: Patient has no known allergies.  Family History  Problem Relation Age of Onset  . Breast cancer Sister     SH:  Married, non smoker  Review of Systems  All other systems reviewed and are negative.   PHYSICAL EXAMINATION:    BP 130/88 (BP Location: Right Arm, Patient Position: Sitting, Cuff Size: Large)   Pulse 68   Resp 16   Ht 5' 0.25" (1.53 m)   Wt 149 lb 12.8 oz (67.9 kg)   BMI 29.01 kg/m     Physical Exam  Constitutional: She appears well-developed and well-nourished.  Neck: No thyromegaly present.  Cardiovascular: Normal rate and regular rhythm.  Respiratory: Effort normal and breath sounds normal. She exhibits tenderness (on both sides of lower sternum) and bony tenderness. She exhibits no mass, no edema and no swelling. Right breast exhibits no inverted nipple, no mass, no nipple discharge, no skin change and no tenderness.  Left breast exhibits no inverted nipple, no mass, no nipple discharge, no skin change and no tenderness. Breasts are symmetrical.    Lymphadenopathy:    She has no cervical adenopathy.   Assessment: Chest wall pain, likely due to cartilege injury from wakeboarding H/O BRCA 1 + testing H/O breast cancer 2005, s/p bilateral mastectomy with bilateral TRAM flap reconstruction  Plan: Celebrex 4110m x 1 day, then 2078mdaily.   Consider PT for possible taping to help Release of records signed today for CXR, in particular Sports bra to help with some pressure on the chest wall encouraged. Heat encouraged. Will reassess after two weeks.  Did advise pt is she just decides she needs breast imaging for peace of mind  to please call but I really feel this is cartilage injury.

## 2018-07-15 NOTE — Telephone Encounter (Signed)
Call to patient and office visit with Dr. Sabra Heck today and patient accepted.

## 2018-07-16 ENCOUNTER — Ambulatory Visit: Payer: BLUE CROSS/BLUE SHIELD | Admitting: Certified Nurse Midwife

## 2018-09-25 ENCOUNTER — Ambulatory Visit: Payer: BLUE CROSS/BLUE SHIELD | Admitting: Certified Nurse Midwife

## 2018-10-16 ENCOUNTER — Ambulatory Visit (INDEPENDENT_AMBULATORY_CARE_PROVIDER_SITE_OTHER): Payer: BLUE CROSS/BLUE SHIELD | Admitting: Certified Nurse Midwife

## 2018-10-16 ENCOUNTER — Encounter: Payer: Self-pay | Admitting: Certified Nurse Midwife

## 2018-10-16 ENCOUNTER — Other Ambulatory Visit: Payer: Self-pay

## 2018-10-16 ENCOUNTER — Other Ambulatory Visit (HOSPITAL_COMMUNITY)
Admission: RE | Admit: 2018-10-16 | Discharge: 2018-10-16 | Disposition: A | Discharge status: Home / Self Care | Payer: BLUE CROSS/BLUE SHIELD | Source: Ambulatory Visit | Attending: Obstetrics & Gynecology | Admitting: Obstetrics & Gynecology

## 2018-10-16 VITALS — BP 120/80 | HR 64 | Resp 16 | Ht 59.75 in | Wt 148.0 lb

## 2018-10-16 DIAGNOSIS — R635 Abnormal weight gain: Secondary | ICD-10-CM | POA: Diagnosis not present

## 2018-10-16 DIAGNOSIS — Z124 Encounter for screening for malignant neoplasm of cervix: Secondary | ICD-10-CM

## 2018-10-16 DIAGNOSIS — Z0000 Encounter for general adult medical examination without abnormal findings: Secondary | ICD-10-CM | POA: Diagnosis not present

## 2018-10-16 DIAGNOSIS — E559 Vitamin D deficiency, unspecified: Secondary | ICD-10-CM

## 2018-10-16 DIAGNOSIS — Z01419 Encounter for gynecological examination (general) (routine) without abnormal findings: Secondary | ICD-10-CM | POA: Diagnosis not present

## 2018-10-16 DIAGNOSIS — Z853 Personal history of malignant neoplasm of breast: Secondary | ICD-10-CM

## 2018-10-16 NOTE — Progress Notes (Signed)
47 y.o. [id number] Married  Caucasian Fe here for annual exam. Denies vaginal bleeding or vaginal dryness. Has gained weight and feels has continued since breast cancer occurrence. Has changed diet with no significant change. Exercises daily. No overeating that she is aware of. Sees Urgent care if needed. Desires screening labs today. No other health issues.  No LMP recorded. Patient has had a hysterectomy.          Sexually active: Yes.    The current method of family planning is status post hysterectomy.    Exercising: Yes.    running, strength training Smoker:  no  Review of Systems  Constitutional: Negative.   HENT: Negative.   Eyes: Negative.   Respiratory: Negative.   Cardiovascular: Negative.   Gastrointestinal: Negative.   Genitourinary: Negative.   Musculoskeletal: Negative.   Skin: Negative.   Neurological: Negative.   Endo/Heme/Allergies: Negative.   Psychiatric/Behavioral: Negative.     Health Maintenance: Pap:  09-14-17 neg HPV HR neg, patient still has cervix History of Abnormal Pap: no MMG:  2013 mastectomy Self Breast exams: yes Colonoscopy:  none BMD:   none TDaP:  unsure Shingles: no Pneumonia: no Hep C and HIV: not done Labs: yes   reports that she has never smoked. She has never used smokeless tobacco. She reports that she drinks alcohol. She reports that she does not use drugs.  Past Medical History:  Diagnosis Date  . Cancer (HCC)    BREAST  . Infertility, female   . Migraines    in the past  . Thyroid nodule     Past Surgical History:  Procedure Laterality Date  . ABDOMINAL HYSTERECTOMY  2006   still has cervix  . BREAST LUMPECTOMY  2005  . MASTECTOMY     bilateral  . TRAM  2006   WITH RECONSTRUCTION    Current Outpatient Medications  Medication Sig Dispense Refill  . celecoxib (CELEBREX) 200 MG capsule Take 1 capsule (200 mg total) by mouth 2 (two) times daily. 30 capsule 0  . fluocinonide (LIDEX) 0.05 % external solution daily as  needed.  0  . nystatin cream (MYCOSTATIN) Apply 1 application topically 2 (two) times daily. Apply to affected area BID for up to 5 days 30 g 0   No current facility-administered medications for this visit.     Family History  Problem Relation Age of Onset  . Breast cancer Sister     ROS:  Pertinent items are noted in HPI.  Otherwise, a comprehensive ROS was negative.  Exam:   There were no vitals taken for this visit.   Ht Readings from Last 3 Encounters:  07/15/18 5' 0.25" (1.53 m)  09/14/17 5' 0.25" (1.53 m)  10/01/14 5\' 1"  (1.549 m)    General appearance: alert, cooperative and appears stated age Head: Normocephalic, without obvious abnormality, atraumatic Neck: no adenopathy, supple, symmetrical, trachea midline and thyroid normal to inspection and palpation and no change in nodule Lungs: clear to auscultation bilaterally Breasts: normal appearance, no masses or tenderness, implants feel intact bilateral has nipple tattoo  Tram scarring Heart: regular rate and rhythm Abdomen: soft, non-tender; no masses,  no organomegaly Extremities: extremities normal, atraumatic, no cyanosis or edema Skin: Skin color, texture, turgor normal. No rashes or lesions Lymph nodes: Cervical, supraclavicular, and axillary nodes normal. No abnormal inguinal nodes palpated Neurologic: Grossly normal   Pelvic: External genitalia:  no lesions normal female              Urethra:  normal appearing urethra with no masses, tenderness or lesions              Bartholin's and Skene's: normal                 Vagina: normal appearing vagina with normal color and discharge, no lesions              Cervix: no cervical motion tenderness, no lesions and nulliparous appearance              Pap taken: Yes.   Bimanual Exam:  Uterus:  uterus absent              Adnexa: no mass, fullness, tenderness               Rectovaginal: Confirms               Anus:  normal sphincter tone, no lesions  Chaperone present:  yes  A:  Well Woman with normal exam  Contraception none supracervical hysterectomy  History of breast cancer with bilateral mastectomy with implants  Weight gain  Screening labs  Colonoscopy due  P:   Reviewed health and wellness pertinent to exam  Discussed if vaginal bleeding needs to advise  Continue follow up with breast cancer as indicated with oncology  Discussed starting on weight watcher trial and changing diet habits. Continue exercise. Patient will consider.  Labs: CMP,Lipid panel ,TSH, Vit D, CBC  Discussed risks/benefits/ of colonoscopy will decide and advise.  Pap smear: yes   counseled on breast self exam, mammography screening, adequate intake of calcium and vitamin D, diet and exercise  return annually or prn  An After Visit Summary was printed and given to the patient.

## 2018-10-17 ENCOUNTER — Other Ambulatory Visit: Payer: Self-pay | Admitting: Certified Nurse Midwife

## 2018-10-17 DIAGNOSIS — E782 Mixed hyperlipidemia: Secondary | ICD-10-CM

## 2018-10-17 DIAGNOSIS — E559 Vitamin D deficiency, unspecified: Secondary | ICD-10-CM

## 2018-10-17 LAB — COMPREHENSIVE METABOLIC PANEL
ALT: 12 IU/L (ref 0–32)
AST: 16 IU/L (ref 0–40)
Albumin/Globulin Ratio: 2.1 (ref 1.2–2.2)
Albumin: 4.7 g/dL (ref 3.5–5.5)
Alkaline Phosphatase: 104 IU/L (ref 39–117)
BUN/Creatinine Ratio: 17 (ref 9–23)
BUN: 12 mg/dL (ref 6–24)
Bilirubin Total: 0.3 mg/dL (ref 0.0–1.2)
CO2: 27 mmol/L (ref 20–29)
Calcium: 9.4 mg/dL (ref 8.7–10.2)
Chloride: 103 mmol/L (ref 96–106)
Creatinine, Ser: 0.69 mg/dL (ref 0.57–1.00)
GFR calc Af Amer: 118 mL/min/{1.73_m2} (ref >59)
GFR calc non Af Amer: 103 mL/min/{1.73_m2} (ref >59)
Globulin, Total: 2.2 g/dL (ref 1.5–4.5)
Glucose: 91 mg/dL (ref 65–99)
Potassium: 4.2 mmol/L (ref 3.5–5.2)
Sodium: 143 mmol/L (ref 134–144)
Total Protein: 6.9 g/dL (ref 6.0–8.5)

## 2018-10-17 LAB — LIPID PANEL
Chol/HDL Ratio: 2.3 ratio (ref 0.0–4.4)
Cholesterol, Total: 240 mg/dL — ABNORMAL HIGH (ref 100–199)
HDL: 104 mg/dL (ref >39)
LDL Calculated: 104 mg/dL — ABNORMAL HIGH (ref 0–99)
Triglycerides: 161 mg/dL — ABNORMAL HIGH (ref 0–149)
VLDL Cholesterol Cal: 32 mg/dL (ref 5–40)

## 2018-10-17 LAB — CBC
Hematocrit: 40.1 % (ref 34.0–46.6)
Hemoglobin: 13.4 g/dL (ref 11.1–15.9)
MCH: 29.8 pg (ref 26.6–33.0)
MCHC: 33.4 g/dL (ref 31.5–35.7)
MCV: 89 fL (ref 79–97)
Platelets: 210 10*3/uL (ref 150–450)
RBC: 4.5 x10E6/uL (ref 3.77–5.28)
RDW: 12.8 % (ref 12.3–15.4)
WBC: 4.8 10*3/uL (ref 3.4–10.8)

## 2018-10-17 LAB — TSH: TSH: 1.23 u[IU]/mL (ref 0.450–4.500)

## 2018-10-17 LAB — VITAMIN D 25 HYDROXY (VIT D DEFICIENCY, FRACTURES): Vit D, 25-Hydroxy: 23.5 ng/mL — ABNORMAL LOW (ref 30.0–100.0)

## 2018-10-18 LAB — CYTOLOGY - PAP
Diagnosis: NEGATIVE
HPV: NOT DETECTED

## 2019-01-20 ENCOUNTER — Other Ambulatory Visit: Payer: BLUE CROSS/BLUE SHIELD

## 2019-02-24 ENCOUNTER — Other Ambulatory Visit: Payer: BLUE CROSS/BLUE SHIELD

## 2019-03-05 ENCOUNTER — Other Ambulatory Visit: Payer: Self-pay

## 2019-05-07 ENCOUNTER — Other Ambulatory Visit: Payer: BLUE CROSS/BLUE SHIELD

## 2019-05-07 ENCOUNTER — Other Ambulatory Visit: Payer: Self-pay | Admitting: *Deleted

## 2019-05-07 ENCOUNTER — Telehealth: Payer: Self-pay | Admitting: Obstetrics and Gynecology

## 2019-05-07 DIAGNOSIS — E782 Mixed hyperlipidemia: Secondary | ICD-10-CM

## 2019-05-07 DIAGNOSIS — E559 Vitamin D deficiency, unspecified: Secondary | ICD-10-CM

## 2019-05-07 NOTE — Telephone Encounter (Signed)
Patient dnka her lab appointment today. I left her a message to call and reschedule.

## 2019-06-26 ENCOUNTER — Ambulatory Visit: Payer: BLUE CROSS/BLUE SHIELD | Admitting: Orthotics

## 2019-06-26 ENCOUNTER — Other Ambulatory Visit: Payer: Self-pay

## 2019-06-26 DIAGNOSIS — M79676 Pain in unspecified toe(s): Secondary | ICD-10-CM

## 2019-06-26 DIAGNOSIS — M722 Plantar fascial fibromatosis: Secondary | ICD-10-CM

## 2019-06-26 NOTE — Progress Notes (Signed)
Recovering old f/o

## 2019-10-21 ENCOUNTER — Ambulatory Visit: Payer: BLUE CROSS/BLUE SHIELD | Admitting: Certified Nurse Midwife

## 2019-12-22 ENCOUNTER — Ambulatory Visit: Payer: BLUE CROSS/BLUE SHIELD | Admitting: Certified Nurse Midwife

## 2019-12-22 NOTE — Progress Notes (Deleted)
49 y.o. Christie Burton:[id number] Married  Caucasian Fe here for annual exam.    No LMP recorded. Patient has had a hysterectomy.          Sexually active: {yes no:314532}  The current method of family planning is status post hysterectomy. (supracervical   Exercising: {yes no:314532}  {types:19826} Smoker:  {YES NO:22349}  ROS  Health Maintenance: Pap:  09-14-17 neg HPV HR neg, 10-16-18 neg HPV HR neg, still has cervix History of Abnormal Pap: {YES NO:22349} MMG:  2013 mastectomy Self Breast exams: {YES NO:22349} Colonoscopy:  none BMD:   none TDaP:  *** Shingles: no Pneumonia: no Hep C and HIV: *** Labs: ***   reports that she has never smoked. She has never used smokeless tobacco. She reports current alcohol use of about 3.0 - 4.0 standard drinks of alcohol per week. She reports that she does not use drugs.  Past Medical History:  Diagnosis Date  . Cancer (HCC)    BREAST  . Infertility, female   . Migraines    in the past  . Thyroid nodule     Past Surgical History:  Procedure Laterality Date  . ABDOMINAL HYSTERECTOMY  2006   still has cervix  . BREAST LUMPECTOMY  2005  . MASTECTOMY     bilateral  . TRAM  2006   WITH RECONSTRUCTION    Current Outpatient Medications  Medication Sig Dispense Refill  . fluocinonide (LIDEX) 0.05 % external solution daily as needed.  0  . nystatin cream (MYCOSTATIN) Apply 1 application topically 2 (two) times daily. Apply to affected area BID for up to 5 days 30 g 0   No current facility-administered medications for this visit.    Family History  Problem Relation Age of Onset  . Breast cancer Sister     ROS:  Pertinent items are noted in HPI.  Otherwise, a comprehensive ROS was negative.  Exam:   There were no vitals taken for this visit.   Ht Readings from Last 3 Encounters:  10/16/18 4' 11.75" (1.518 m)  07/15/18 5' 0.25" (1.53 m)  09/14/17 5' 0.25" (1.53 m)    General appearance: alert, cooperative and appears stated age Head:  Normocephalic, without obvious abnormality, atraumatic Neck: no adenopathy, supple, symmetrical, trachea midline and thyroid {EXAM; THYROID:18604} Lungs: clear to auscultation bilaterally Breasts: {Exam; breast:13139::"normal appearance, no masses or tenderness"} Heart: regular rate and rhythm Abdomen: soft, non-tender; no masses,  no organomegaly Extremities: extremities normal, atraumatic, no cyanosis or edema Skin: Skin color, texture, turgor normal. No rashes or lesions Lymph nodes: Cervical, supraclavicular, and axillary nodes normal. No abnormal inguinal nodes palpated Neurologic: Grossly normal   Pelvic: External genitalia:  no lesions              Urethra:  normal appearing urethra with no masses, tenderness or lesions              Bartholin's and Skene's: normal                 Vagina: normal appearing vagina with normal color and discharge, no lesions              Cervix: {exam; cervix:14595}              Pap taken: {yes no:314532} Bimanual Exam:  Uterus:  {exam; uterus:12215}              Adnexa: {exam; adnexa:12223}               Rectovaginal: Confirms  Anus:  normal sphincter tone, no lesions  Chaperone present: ***  A:  Well Woman with normal exam  P:   Reviewed health and wellness pertinent to exam  Pap smear: {YES NO:22349}  {plan; gyn:5269::"mammogram","pap smear","return annually or prn"}  An After Visit Summary was printed and given to the patient.

## 2020-03-01 ENCOUNTER — Encounter: Payer: Self-pay | Admitting: Certified Nurse Midwife

## 2020-07-21 ENCOUNTER — Encounter: Payer: Self-pay | Admitting: Genetic Counselor

## 2020-08-10 ENCOUNTER — Other Ambulatory Visit: Payer: Self-pay | Admitting: Oncology
